# Patient Record
Sex: Female | Born: 1960 | Smoking: Current every day smoker
Health system: Southern US, Community
[De-identification: ages and names within clinical notes are randomized; demographics above are authoritative.]

## PROBLEM LIST (undated history)

## (undated) DIAGNOSIS — N200 Calculus of kidney: Secondary | ICD-10-CM

## (undated) DIAGNOSIS — I10 Essential (primary) hypertension: Secondary | ICD-10-CM

## (undated) DIAGNOSIS — I219 Acute myocardial infarction, unspecified: Secondary | ICD-10-CM

## (undated) DIAGNOSIS — E785 Hyperlipidemia, unspecified: Secondary | ICD-10-CM

## (undated) DIAGNOSIS — N189 Chronic kidney disease, unspecified: Secondary | ICD-10-CM

## (undated) DIAGNOSIS — I809 Phlebitis and thrombophlebitis of unspecified site: Secondary | ICD-10-CM

## (undated) DIAGNOSIS — D689 Coagulation defect, unspecified: Secondary | ICD-10-CM

## (undated) DIAGNOSIS — J189 Pneumonia, unspecified organism: Secondary | ICD-10-CM

## (undated) DIAGNOSIS — N186 End stage renal disease: Secondary | ICD-10-CM

## (undated) DIAGNOSIS — D649 Anemia, unspecified: Secondary | ICD-10-CM

## (undated) HISTORY — DX: Anemia, unspecified: D64.9

## (undated) HISTORY — PX: CYSTOSCOPY: SUR368

## (undated) HISTORY — DX: Hyperlipidemia, unspecified: E78.5

## (undated) HISTORY — DX: Essential (primary) hypertension: I10

## (undated) HISTORY — DX: Chronic kidney disease, unspecified: N18.9

## (undated) HISTORY — DX: Coagulation defect, unspecified: D68.9

## (undated) HISTORY — PX: INSERTION OF DIALYSIS CATHETER: SHX1324

---

## 2014-08-03 ENCOUNTER — Other Ambulatory Visit: Payer: Self-pay | Admitting: *Deleted

## 2014-08-03 ENCOUNTER — Encounter: Payer: Self-pay | Admitting: Vascular Surgery

## 2014-08-03 DIAGNOSIS — Z01810 Encounter for preprocedural cardiovascular examination: Secondary | ICD-10-CM

## 2014-08-03 DIAGNOSIS — N186 End stage renal disease: Secondary | ICD-10-CM

## 2014-08-18 ENCOUNTER — Encounter: Payer: Self-pay | Admitting: Vascular Surgery

## 2014-08-23 ENCOUNTER — Ambulatory Visit (INDEPENDENT_AMBULATORY_CARE_PROVIDER_SITE_OTHER): Payer: Medicaid Other | Admitting: Vascular Surgery

## 2014-08-23 ENCOUNTER — Encounter: Payer: Self-pay | Admitting: Vascular Surgery

## 2014-08-23 ENCOUNTER — Ambulatory Visit (HOSPITAL_COMMUNITY)
Admission: RE | Admit: 2014-08-23 | Discharge: 2014-08-23 | Disposition: A | Discharge status: Home / Self Care | Payer: Medicaid Other | Source: Ambulatory Visit | Attending: Vascular Surgery | Admitting: Vascular Surgery

## 2014-08-23 ENCOUNTER — Ambulatory Visit (INDEPENDENT_AMBULATORY_CARE_PROVIDER_SITE_OTHER)
Admission: RE | Admit: 2014-08-23 | Discharge: 2014-08-23 | Disposition: A | Discharge status: Home / Self Care | Payer: Medicaid Other | Source: Ambulatory Visit | Attending: Vascular Surgery | Admitting: Vascular Surgery

## 2014-08-23 VITALS — BP 130/76 | HR 86 | Ht 66.0 in | Wt 168.7 lb

## 2014-08-23 DIAGNOSIS — Z01810 Encounter for preprocedural cardiovascular examination: Secondary | ICD-10-CM

## 2014-08-23 DIAGNOSIS — N179 Acute kidney failure, unspecified: Secondary | ICD-10-CM

## 2014-08-23 DIAGNOSIS — N186 End stage renal disease: Secondary | ICD-10-CM

## 2014-08-23 NOTE — Progress Notes (Signed)
  HISTORY AND PHYSICAL   History of Present Illness:  Patient is a 53 y.o. year old female who presents for placement of a permanent hemodialysis access. The patient is right handed .  The patient is currently on hemodialysis.  The cause of renal failure is thought to be secondary to NSAIDS.  Other chronic medical problems include .  Past Medical History  Diagnosis Date  . Hyperlipidemia   . Anemia   . Hypertension   . Chronic kidney disease   . Coagulation defect     History reviewed. No pertinent past surgical history.   Social History History  Substance Use Topics  . Smoking status: Current Every Day Smoker -- 0.50 packs/day    Types: Cigarettes  . Smokeless tobacco: Not on file  . Alcohol Use: No    Family History Family History  Problem Relation Age of Onset  . Cancer Mother     Allergies  Allergies  Allergen Reactions  . Ampicillin Other (See Comments)    Patient states per office notes from [facility] that it makes her gut bleed and swell  . Penicillins Hives     Current Outpatient Prescriptions  Medication Sig Dispense Refill  . atorvastatin (LIPITOR) 80 MG tablet Take 80 mg by mouth daily.    . furosemide (LASIX) 80 MG tablet Take 80 mg by mouth. Take on non-dialysis days    . magnesium oxide (MAG-OX) 400 MG tablet Take 400 mg by mouth 2 (two) times daily.    . metoprolol tartrate (LOPRESSOR) 25 MG tablet Take 25 mg by mouth at bedtime.    . pantoprazole (PROTONIX) 40 MG tablet Take 40 mg by mouth daily.    . ACETAMINOPHEN PO Take 650 mg by mouth 4 (four) times daily as needed.     No current facility-administered medications for this visit.    ROS:   General:  No weight loss, Fever, chills  HEENT: No recent headaches, no nasal bleeding, no visual changes, no sore throat  Neurologic: No dizziness, blackouts, seizures. No recent symptoms of stroke or mini- stroke. No recent episodes of slurred speech, or temporary  blindness.  Cardiac: No recent episodes of chest pain/pressure, no shortness of breath at rest.  No shortness of breath with exertion.  Denies history of atrial fibrillation or irregular heartbeat  Vascular: No history of rest pain in feet.  No history of claudication.  No history of non-healing ulcer, No history of DVT   Pulmonary: No home oxygen, no productive cough, no hemoptysis,  No asthma or wheezing  Musculoskeletal:  [x ] Arthritis, [ ] Low back pain,  [x ] Joint pain  Hematologic:No history of hypercoagulable state.  No history of easy bleeding.  No history of anemia  Gastrointestinal: No hematochezia or melena,  No gastroesophageal reflux, no trouble swallowing  Urinary: [x ] chronic Kidney disease, [x ] on HD - [ ] MWF or [x ] TTHS, [ ] Burning with urination, [ ] Frequent urination, [ ] Difficulty urinating;   Skin: No rashes  Psychological: No history of anxiety,  No history of depression   Physical Examination  Filed Vitals:   08/23/14 1519  BP: 130/76  Pulse: 86  Height: 5' 6" (1.676 m)  Weight: 168 lb 11.2 oz (76.522 kg)  SpO2: 95%    Body mass index is 27.24 kg/(m^2).  General:  Alert and oriented, no acute distress HEENT: Normal Neck: No bruit or JVD Pulmonary: Clear to auscultation bilaterally Cardiac: Regular Rate   and Rhythm without murmur Gastrointestinal: Soft, non-tender, non-distended, no mass, no scars Skin: No rash Extremity Pulses:  2+ radial, brachial pulses bilaterally, right IJ in place Musculoskeletal: No deformity or edema  Neurologic: Upper and lower extremity motor 5/5 and symmetric  DATA:  Vein mapping Acceptable cephalic veins bilaterally > 0.3 Arteries are triphasic flow bilateral  ASSESSMENT:  Acute kidney failure   PLAN: Left AV fistula creation.  Dr. Dickson discussed the risks and benefits of surgery.  She is not on any anticoagulation therapy currently.    Christie Montgomery PA-C [facility]  [place]  The patient was seen in the office today in conjunction with Dr. Dickson 

## 2014-08-24 ENCOUNTER — Other Ambulatory Visit: Payer: Self-pay

## 2014-09-07 ENCOUNTER — Encounter (HOSPITAL_COMMUNITY): Payer: Self-pay | Admitting: *Deleted

## 2014-09-07 MED ORDER — VANCOMYCIN HCL IN DEXTROSE 1-5 GM/200ML-% IV SOLN
1000.0000 mg | INTRAVENOUS | Status: AC
  Administered 2014-09-08: 1000 mg via INTRAVENOUS
  Filled 2014-09-07: qty 200

## 2014-09-07 MED ORDER — CHLORHEXIDINE GLUCONATE CLOTH 2 % EX PADS: 6.0000 | MEDICATED_PAD | CUTANEOUS | Status: DC

## 2014-09-07 MED ORDER — SODIUM CHLORIDE 0.9 % IV SOLN: INTRAVENOUS | Status: DC

## 2014-09-07 NOTE — Progress Notes (Signed)
Pt denies SOB, chest pain, and being under the care of a cardiologist. Pt denies having a stress test, echo and cardiac cath. Pt stated that a chest x ray and EKG was done at [facility]; records requested. Pt made aware to stop taking otc vitamins, NSAID's and herbal medications. Pt verbalized understanding of all pre-op instructions.

## 2014-09-08 ENCOUNTER — Ambulatory Visit (HOSPITAL_COMMUNITY): Payer: Medicaid Other | Admitting: Anesthesiology

## 2014-09-08 ENCOUNTER — Encounter (HOSPITAL_COMMUNITY)
Admission: RE | Disposition: A | Discharge status: Home / Self Care | Payer: Self-pay | Source: Ambulatory Visit | Attending: Vascular Surgery

## 2014-09-08 ENCOUNTER — Ambulatory Visit (HOSPITAL_COMMUNITY)
Admission: RE | Admit: 2014-09-08 | Discharge: 2014-09-08 | Disposition: A | Discharge status: Home / Self Care | Payer: Medicaid Other | Source: Ambulatory Visit | Attending: Vascular Surgery | Admitting: Vascular Surgery

## 2014-09-08 ENCOUNTER — Encounter (HOSPITAL_COMMUNITY): Payer: Self-pay

## 2014-09-08 ENCOUNTER — Other Ambulatory Visit: Payer: Medicaid Other

## 2014-09-08 DIAGNOSIS — E785 Hyperlipidemia, unspecified: Secondary | ICD-10-CM | POA: Diagnosis not present

## 2014-09-08 DIAGNOSIS — N179 Acute kidney failure, unspecified: Secondary | ICD-10-CM | POA: Insufficient documentation

## 2014-09-08 DIAGNOSIS — Z992 Dependence on renal dialysis: Secondary | ICD-10-CM | POA: Insufficient documentation

## 2014-09-08 DIAGNOSIS — N186 End stage renal disease: Secondary | ICD-10-CM | POA: Diagnosis not present

## 2014-09-08 DIAGNOSIS — F17210 Nicotine dependence, cigarettes, uncomplicated: Secondary | ICD-10-CM | POA: Diagnosis not present

## 2014-09-08 DIAGNOSIS — I120 Hypertensive chronic kidney disease with stage 5 chronic kidney disease or end stage renal disease: Secondary | ICD-10-CM | POA: Diagnosis present

## 2014-09-08 DIAGNOSIS — N185 Chronic kidney disease, stage 5: Secondary | ICD-10-CM | POA: Diagnosis not present

## 2014-09-08 DIAGNOSIS — Z791 Long term (current) use of non-steroidal anti-inflammatories (NSAID): Secondary | ICD-10-CM | POA: Insufficient documentation

## 2014-09-08 DIAGNOSIS — Z48812 Encounter for surgical aftercare following surgery on the circulatory system: Secondary | ICD-10-CM

## 2014-09-08 DIAGNOSIS — Z880 Allergy status to penicillin: Secondary | ICD-10-CM | POA: Diagnosis not present

## 2014-09-08 HISTORY — DX: Calculus of kidney: N20.0

## 2014-09-08 HISTORY — DX: Pneumonia, unspecified organism: J18.9

## 2014-09-08 HISTORY — PX: AV FISTULA PLACEMENT: SHX1204

## 2014-09-08 LAB — POCT I-STAT 4, (NA,K, GLUC, HGB,HCT)
GLUCOSE: 83 mg/dL (ref 65–99)
HEMATOCRIT: 34 % — AB (ref 36.0–46.0)
HEMOGLOBIN: 11.6 g/dL — AB (ref 12.0–15.0)
POTASSIUM: 3.5 mmol/L (ref 3.5–5.1)
SODIUM: 132 mmol/L — AB (ref 135–145)

## 2014-09-08 SURGERY — ARTERIOVENOUS (AV) FISTULA CREATION
Anesthesia: Monitor Anesthesia Care | Site: Arm Upper | Laterality: Left

## 2014-09-08 MED ORDER — ONDANSETRON HCL 4 MG/2ML IJ SOLN
INTRAMUSCULAR | Status: DC | PRN
  Administered 2014-09-08: 4 mg via INTRAVENOUS

## 2014-09-08 MED ORDER — PROPOFOL INFUSION 10 MG/ML OPTIME
INTRAVENOUS | Status: DC | PRN
  Administered 2014-09-08: 100 ug/kg/min via INTRAVENOUS

## 2014-09-08 MED ORDER — PROTAMINE SULFATE 10 MG/ML IV SOLN
INTRAVENOUS | Status: DC | PRN
  Administered 2014-09-08: 30 mg via INTRAVENOUS

## 2014-09-08 MED ORDER — LIDOCAINE HCL (PF) 1 % IJ SOLN
INTRAMUSCULAR | Status: DC | PRN
  Administered 2014-09-08: 30 mL

## 2014-09-08 MED ORDER — OXYCODONE HCL 5 MG PO TABS: 5.0000 mg | ORAL_TABLET | Freq: Four times a day (QID) | ORAL | Status: DC | PRN

## 2014-09-08 MED ORDER — HYDROMORPHONE HCL 1 MG/ML IJ SOLN: 0.2500 mg | INTRAMUSCULAR | Status: DC | PRN

## 2014-09-08 MED ORDER — LIDOCAINE HCL (PF) 1 % IJ SOLN
INTRAMUSCULAR | Status: AC
  Filled 2014-09-08: qty 30

## 2014-09-08 MED ORDER — SODIUM CHLORIDE 0.9 % IR SOLN
Status: DC | PRN
  Administered 2014-09-08: 07:00:00

## 2014-09-08 MED ORDER — FENTANYL CITRATE (PF) 100 MCG/2ML IJ SOLN
INTRAMUSCULAR | Status: DC | PRN
  Administered 2014-09-08 (×3): 25 ug via INTRAVENOUS
  Administered 2014-09-08 (×2): 50 ug via INTRAVENOUS
  Administered 2014-09-08: 25 ug via INTRAVENOUS

## 2014-09-08 MED ORDER — LACTATED RINGERS IV SOLN: INTRAVENOUS | Status: DC

## 2014-09-08 MED ORDER — PROMETHAZINE HCL 25 MG/ML IJ SOLN: 6.2500 mg | INTRAMUSCULAR | Status: DC | PRN

## 2014-09-08 MED ORDER — FENTANYL CITRATE (PF) 250 MCG/5ML IJ SOLN
INTRAMUSCULAR | Status: AC
  Filled 2014-09-08: qty 5

## 2014-09-08 MED ORDER — LIDOCAINE-EPINEPHRINE (PF) 1 %-1:200000 IJ SOLN
INTRAMUSCULAR | Status: AC
  Filled 2014-09-08: qty 30

## 2014-09-08 MED ORDER — MIDAZOLAM HCL 5 MG/5ML IJ SOLN
INTRAMUSCULAR | Status: DC | PRN
  Administered 2014-09-08: 2 mg via INTRAVENOUS

## 2014-09-08 MED ORDER — HEPARIN SODIUM (PORCINE) 1000 UNIT/ML IJ SOLN
INTRAMUSCULAR | Status: DC | PRN
  Administered 2014-09-08: 6000 [IU] via INTRAVENOUS

## 2014-09-08 MED ORDER — PROPOFOL 10 MG/ML IV BOLUS
INTRAVENOUS | Status: AC
  Filled 2014-09-08: qty 20

## 2014-09-08 MED ORDER — 0.9 % SODIUM CHLORIDE (POUR BTL) OPTIME
TOPICAL | Status: DC | PRN
  Administered 2014-09-08: 1000 mL

## 2014-09-08 MED ORDER — MEPERIDINE HCL 25 MG/ML IJ SOLN: 6.2500 mg | INTRAMUSCULAR | Status: DC | PRN

## 2014-09-08 MED ORDER — SODIUM CHLORIDE 0.9 % IV SOLN
INTRAVENOUS | Status: DC | PRN
  Administered 2014-09-08: 07:00:00 via INTRAVENOUS

## 2014-09-08 MED ORDER — MIDAZOLAM HCL 2 MG/2ML IJ SOLN
INTRAMUSCULAR | Status: AC
  Filled 2014-09-08: qty 4

## 2014-09-08 SURGICAL SUPPLY — 32 items
ARMBAND PINK RESTRICT EXTREMIT (MISCELLANEOUS) ×3 IMPLANT
CANISTER SUCTION 2500CC (MISCELLANEOUS) ×3 IMPLANT
CANNULA VESSEL 3MM 2 BLNT TIP (CANNULA) ×3 IMPLANT
CLIP TI MEDIUM 6 (CLIP) ×3 IMPLANT
CLIP TI WIDE RED SMALL 6 (CLIP) ×3 IMPLANT
COVER PROBE W GEL 5X96 (DRAPES) ×3 IMPLANT
DECANTER SPIKE VIAL GLASS SM (MISCELLANEOUS) ×3 IMPLANT
ELECT REM PT RETURN 9FT ADLT (ELECTROSURGICAL) ×3
ELECTRODE REM PT RTRN 9FT ADLT (ELECTROSURGICAL) ×1 IMPLANT
GLOVE BIO SURGEON STRL SZ 6.5 (GLOVE) ×4 IMPLANT
GLOVE BIO SURGEON STRL SZ7.5 (GLOVE) ×3 IMPLANT
GLOVE BIO SURGEONS STRL SZ 6.5 (GLOVE) ×2
GLOVE BIOGEL PI IND STRL 7.0 (GLOVE) ×1 IMPLANT
GLOVE BIOGEL PI IND STRL 8 (GLOVE) ×2 IMPLANT
GLOVE BIOGEL PI INDICATOR 7.0 (GLOVE) ×2
GLOVE BIOGEL PI INDICATOR 8 (GLOVE) ×4
GLOVE ECLIPSE 7.5 STRL STRAW (GLOVE) ×3 IMPLANT
GOWN STRL REUS W/ TWL LRG LVL3 (GOWN DISPOSABLE) ×3 IMPLANT
GOWN STRL REUS W/TWL LRG LVL3 (GOWN DISPOSABLE) ×6
KIT BASIN OR (CUSTOM PROCEDURE TRAY) ×3 IMPLANT
KIT ROOM TURNOVER OR (KITS) ×3 IMPLANT
LIQUID BAND (GAUZE/BANDAGES/DRESSINGS) ×3 IMPLANT
NS IRRIG 1000ML POUR BTL (IV SOLUTION) ×3 IMPLANT
PACK CV ACCESS (CUSTOM PROCEDURE TRAY) ×3 IMPLANT
PAD ARMBOARD 7.5X6 YLW CONV (MISCELLANEOUS) ×6 IMPLANT
SPONGE SURGIFOAM ABS GEL 100 (HEMOSTASIS) IMPLANT
SUT PROLENE 6 0 BV (SUTURE) ×3 IMPLANT
SUT VIC AB 3-0 SH 27 (SUTURE) ×2
SUT VIC AB 3-0 SH 27X BRD (SUTURE) ×1 IMPLANT
SUT VICRYL 4-0 PS2 18IN ABS (SUTURE) ×3 IMPLANT
UNDERPAD 30X30 INCONTINENT (UNDERPADS AND DIAPERS) ×3 IMPLANT
WATER STERILE IRR 1000ML POUR (IV SOLUTION) ×3 IMPLANT

## 2014-09-08 NOTE — Op Note (Signed)
    NAME: Christie Montgomery   MRN: [id number] DOB: 1960/12/31    DATE OF OPERATION: 09/08/2014  PREOP DIAGNOSIS: Stage V chronic kidney disease  POSTOP DIAGNOSIS: Same  PROCEDURE: Left brachial cephalic AV fistula  SURGEON: Di Kindle. Edilia Bo, MD, FACS  ASSIST: Doreatha Massed, PA  ANESTHESIA: local with sedation   EBL: minimal  INDICATIONS: Christie Montgomery is a 54 y.o. female who currently is on dialysis and has a functioning catheter.  She presents for new access.  FINDINGS: 4 mm upper arm cephalic vein.  TECHNIQUE: The patient was taken to the operating room and sedated by anesthesia. The left upper extremity was prepped and draped in the usual sterile fashion. Under ultrasound guidance, after the skin was anesthetized, a transverse incision was made at the antecubital level. Here the cephalic vein was dissected free and ligated distally. It irrigated up nicely with heparinized saline. The brachial artery was dissected free beneath the fascia. The patient was heparinized. The brachial artery was clamped proximally and distally and a longitudinal arteriotomy was made. The vein was sewn into side to the artery using continuous 60 proline suture. At the completion was a good thrill in the fistula. There was a good radial and ulnar signal with Doppler. Hemostasis was obtained in the wounds. The wound was closed with a deep layer of 3-0 Vicryl and the skin closed with 4-0 Vicryl. Liquid band was applied. The patient tolerated the procedure well and transferred to the recovery room in stable condition. All needle and sponge counts were correct.  Christie Ferrari, MD, FACS [facility]  DATE OF DICTATION:   09/08/2014

## 2014-09-08 NOTE — Anesthesia Preprocedure Evaluation (Addendum)
Anesthesia Evaluation  Patient identified by MRN, date of birth, ID band Patient awake    Reviewed: Allergy & Precautions, NPO status , Patient's Chart, lab work & pertinent test results, reviewed documented beta blocker date and time   Airway Mallampati: II       Dental  (+) Poor Dentition, Loose, Missing   Pulmonary Current Smoker,  breath sounds clear to auscultation  + wheezing      Cardiovascular hypertension, Pt. on medications Rhythm:Regular Rate:Normal     Neuro/Psych negative neurological ROS  negative psych ROS   GI/Hepatic negative GI ROS, Neg liver ROS, GERD-  Medicated,  Endo/Other  negative endocrine ROS  Renal/GU CRFRenal disease  negative genitourinary   Musculoskeletal negative musculoskeletal ROS (+)   Abdominal   Peds negative pediatric ROS (+)  Hematology negative hematology ROS (+)   Anesthesia Other Findings   Reproductive/Obstetrics negative OB ROS                           EKG: normal sinus rhythm.  Anesthesia Physical Anesthesia Plan  ASA: III  Anesthesia Plan: MAC   Post-op Pain Management:    Induction: Intravenous  Airway Management Planned: Natural Airway  Additional Equipment:   Intra-op Plan:   Post-operative Plan:   Informed Consent: I have reviewed the patients History and Physical, chart, labs and discussed the procedure including the risks, benefits and alternatives for the proposed anesthesia with the patient or authorized representative who has indicated his/her understanding and acceptance.   Dental advisory given  Plan Discussed with: CRNA  Anesthesia Plan Comments:         Anesthesia Quick Evaluation

## 2014-09-08 NOTE — Discharge Instructions (Signed)
° ° °  09/08/2014 Christie Montgomery [id number] 01-07-1961  Surgeon(s): Chuck Hint, MD  Procedure(s): LEFT BRACHIAL-CEPHALIC ARTERIOVENOUS (AV) FISTULA CREATION    Do not stick fistula for 12 weeks

## 2014-09-08 NOTE — Transfer of Care (Signed)
Immediate Anesthesia Transfer of Care Note  Patient: Christie Montgomery  Procedure(s) Performed: Procedure(s): LEFT BRACHIAL-CEPHALIC ARTERIOVENOUS (AV) FISTULA CREATION  (Left)  Patient Location: PACU  Anesthesia Type:MAC  Level of Consciousness: awake, alert  and oriented  Airway & Oxygen Therapy: Patient Spontanous Breathing and Patient connected to nasal cannula oxygen  Post-op Assessment: Report given to RN and Post -op Vital signs reviewed and stable  Post vital signs: Reviewed and stable  Last Vitals:  Filed Vitals:   09/08/14 0614  BP: 118/57  Pulse: 82  Temp: 36.1 C  Resp: 20    Complications: No apparent anesthesia complications

## 2014-09-08 NOTE — Anesthesia Postprocedure Evaluation (Signed)
  Anesthesia Post-op Note  Patient: Christie Montgomery  Procedure(s) Performed: Procedure(s): LEFT BRACHIAL-CEPHALIC ARTERIOVENOUS (AV) FISTULA CREATION  (Left)  Patient Location: PACU  Anesthesia Type:MAC  Level of Consciousness: awake, alert  and oriented  Airway and Oxygen Therapy: Patient Spontanous Breathing  Post-op Pain: mild  Post-op Assessment: Post-op Vital signs reviewed and Patient's Cardiovascular Status Stable              Post-op Vital Signs: Reviewed and stable  Last Vitals:  Filed Vitals:   09/08/14 0927  BP: 119/56  Pulse: 82  Temp:   Resp:     Complications: No apparent anesthesia complications

## 2014-09-08 NOTE — Interval H&P Note (Signed)
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 After consideration of risks, benefits and other options for treatment, the patient has consented to  Procedure(s): ARTERIOVENOUS (AV) FISTULA CREATION VERSUS GRAFT INSERTION (Left) as a surgical intervention .  The patient's history has been reviewed, patient examined, no change in status, stable for surgery.  I have reviewed the patient's chart and labs.  Questions were answered to the patient's satisfaction.     [name]

## 2014-09-08 NOTE — H&P (View-Only) (Signed)
HISTORY AND PHYSICAL   History of Present Illness:  Patient is a 54 y.o. year old female who presents for placement of a permanent hemodialysis access. The patient is right handed .  The patient is currently on hemodialysis.  The cause of renal failure is thought to be secondary to NSAIDS.  Other chronic medical problems include .  Past Medical History  Diagnosis Date  . Hyperlipidemia   . Anemia   . Hypertension   . Chronic kidney disease   . Coagulation defect     History reviewed. No pertinent past surgical history.   Social History History  Substance Use Topics  . Smoking status: Current Every Day Smoker -- 0.50 packs/day    Types: Cigarettes  . Smokeless tobacco: Not on file  . Alcohol Use: No    Family History Family History  Problem Relation Age of Onset  . Cancer Mother     Allergies  Allergies  Allergen Reactions  . Ampicillin Other (See Comments)    Patient states per office notes from [facility] that it makes her gut bleed and swell  . Penicillins Hives     Current Outpatient Prescriptions  Medication Sig Dispense Refill  . atorvastatin (LIPITOR) 80 MG tablet Take 80 mg by mouth daily.    . furosemide (LASIX) 80 MG tablet Take 80 mg by mouth. Take on non-dialysis days    . magnesium oxide (MAG-OX) 400 MG tablet Take 400 mg by mouth 2 (two) times daily.    . metoprolol tartrate (LOPRESSOR) 25 MG tablet Take 25 mg by mouth at bedtime.    . pantoprazole (PROTONIX) 40 MG tablet Take 40 mg by mouth daily.    . ACETAMINOPHEN PO Take 650 mg by mouth 4 (four) times daily as needed.     No current facility-administered medications for this visit.    ROS:   General:  No weight loss, Fever, chills  HEENT: No recent headaches, no nasal bleeding, no visual changes, no sore throat  Neurologic: No dizziness, blackouts, seizures. No recent symptoms of stroke or mini- stroke. No recent episodes of slurred speech, or temporary  blindness.  Cardiac: No recent episodes of chest pain/pressure, no shortness of breath at rest.  No shortness of breath with exertion.  Denies history of atrial fibrillation or irregular heartbeat  Vascular: No history of rest pain in feet.  No history of claudication.  No history of non-healing ulcer, No history of DVT   Pulmonary: No home oxygen, no productive cough, no hemoptysis,  No asthma or wheezing  Musculoskeletal:  [x ] Arthritis, [ ]  Low back pain,  [x ] Joint pain  Hematologic:No history of hypercoagulable state.  No history of easy bleeding.  No history of anemia  Gastrointestinal: No hematochezia or melena,  No gastroesophageal reflux, no trouble swallowing  Urinary: [x ] chronic Kidney disease, [x ] on HD - [ ]  MWF or [x ] TTHS, [ ]  Burning with urination, [ ]  Frequent urination, [ ]  Difficulty urinating;   Skin: No rashes  Psychological: No history of anxiety,  No history of depression   Physical Examination  Filed Vitals:   08/23/14 1519  BP: 130/76  Pulse: 86  Height: 5\' 6"  (1.676 m)  Weight: 168 lb 11.2 oz (76.522 kg)  SpO2: 95%    Body mass index is 27.24 kg/(m^2).  General:  Alert and oriented, no acute distress HEENT: Normal Neck: No bruit or JVD Pulmonary: Clear to auscultation bilaterally Cardiac: Regular Rate  and Rhythm without murmur Gastrointestinal: Soft, non-tender, non-distended, no mass, no scars Skin: No rash Extremity Pulses:  2+ radial, brachial pulses bilaterally, right IJ in place Musculoskeletal: No deformity or edema  Neurologic: Upper and lower extremity motor 5/5 and symmetric  DATA:  Vein mapping Acceptable cephalic veins bilaterally > 0.3 Arteries are triphasic flow bilateral  ASSESSMENT:  Acute kidney failure   PLAN: Left AV fistula creation.  Dr. Edilia Bo discussed the risks and benefits of surgery.  She is not on any anticoagulation therapy currently.    Thomasena Edis, EMMA MAUREEN PA-C [facility]  [place]  The patient was seen in the office today in conjunction with Dr. Edilia Bo

## 2014-09-08 NOTE — Addendum Note (Signed)
Addendum  created 09/08/14 1031 by Dairl Ponder, CRNA   Modules edited: Anesthesia Flowsheet

## 2014-09-11 ENCOUNTER — Encounter (HOSPITAL_COMMUNITY): Payer: Self-pay | Admitting: Vascular Surgery

## 2014-09-12 ENCOUNTER — Telehealth: Payer: Self-pay | Admitting: Vascular Surgery

## 2014-09-12 NOTE — Telephone Encounter (Signed)
Spoke with pts spouse- dpm

## 2014-09-12 NOTE — Telephone Encounter (Signed)
-----   Message from Phillips Odor, RN sent at 09/08/2014  9:05 AM EDT ----- Regarding: Joyce Gross log; also needs access duplex and f/u with [facility] in 6 wks.   ----- Message -----    From: Dara Lords, PA-C    Sent: 09/08/2014   8:43 AM      To: [facility] Charge Pool  S/p left BC AVF 09/08/14.  F/u with [facility] in 6 weeks with duplex.  Thanks, Lelon Mast

## 2014-09-20 ENCOUNTER — Inpatient Hospital Stay (HOSPITAL_COMMUNITY)
Admission: EM | Admit: 2014-09-20 | Discharge: 2014-09-23 | DRG: 189 | Disposition: A | Discharge status: Home / Self Care | Payer: Medicaid Other | Attending: Internal Medicine | Admitting: Internal Medicine

## 2014-09-20 ENCOUNTER — Emergency Department (HOSPITAL_COMMUNITY): Payer: Medicaid Other

## 2014-09-20 ENCOUNTER — Encounter (HOSPITAL_COMMUNITY): Payer: Self-pay

## 2014-09-20 DIAGNOSIS — R0602 Shortness of breath: Secondary | ICD-10-CM

## 2014-09-20 DIAGNOSIS — M898X9 Other specified disorders of bone, unspecified site: Secondary | ICD-10-CM | POA: Diagnosis present

## 2014-09-20 DIAGNOSIS — R0600 Dyspnea, unspecified: Secondary | ICD-10-CM

## 2014-09-20 DIAGNOSIS — J90 Pleural effusion, not elsewhere classified: Secondary | ICD-10-CM | POA: Diagnosis present

## 2014-09-20 DIAGNOSIS — Z9115 Patient's noncompliance with renal dialysis: Secondary | ICD-10-CM | POA: Diagnosis present

## 2014-09-20 DIAGNOSIS — J811 Chronic pulmonary edema: Principal | ICD-10-CM | POA: Diagnosis present

## 2014-09-20 DIAGNOSIS — I120 Hypertensive chronic kidney disease with stage 5 chronic kidney disease or end stage renal disease: Secondary | ICD-10-CM | POA: Diagnosis present

## 2014-09-20 DIAGNOSIS — D649 Anemia, unspecified: Secondary | ICD-10-CM | POA: Diagnosis present

## 2014-09-20 DIAGNOSIS — Z992 Dependence on renal dialysis: Secondary | ICD-10-CM

## 2014-09-20 DIAGNOSIS — F17210 Nicotine dependence, cigarettes, uncomplicated: Secondary | ICD-10-CM | POA: Diagnosis present

## 2014-09-20 DIAGNOSIS — E8770 Fluid overload, unspecified: Secondary | ICD-10-CM | POA: Diagnosis present

## 2014-09-20 DIAGNOSIS — E785 Hyperlipidemia, unspecified: Secondary | ICD-10-CM | POA: Diagnosis present

## 2014-09-20 DIAGNOSIS — N186 End stage renal disease: Secondary | ICD-10-CM | POA: Diagnosis present

## 2014-09-20 DIAGNOSIS — Z79899 Other long term (current) drug therapy: Secondary | ICD-10-CM

## 2014-09-20 DIAGNOSIS — R0789 Other chest pain: Secondary | ICD-10-CM

## 2014-09-20 DIAGNOSIS — I2699 Other pulmonary embolism without acute cor pulmonale: Secondary | ICD-10-CM

## 2014-09-20 HISTORY — DX: End stage renal disease: N18.6

## 2014-09-20 LAB — BASIC METABOLIC PANEL
Anion gap: 14 (ref 5–15)
BUN: 21 mg/dL — ABNORMAL HIGH (ref 6–20)
CO2: 22 mmol/L (ref 22–32)
CREATININE: 10.55 mg/dL — AB (ref 0.44–1.00)
Calcium: 10.9 mg/dL — ABNORMAL HIGH (ref 8.9–10.3)
Chloride: 94 mmol/L — ABNORMAL LOW (ref 101–111)
GFR calc Af Amer: 4 mL/min — ABNORMAL LOW (ref >60)
GFR, EST NON AFRICAN AMERICAN: 4 mL/min — AB (ref >60)
GLUCOSE: 95 mg/dL (ref 65–99)
POTASSIUM: 4.9 mmol/L (ref 3.5–5.1)
SODIUM: 130 mmol/L — AB (ref 135–145)

## 2014-09-20 LAB — CBC
HCT: 32.3 % — ABNORMAL LOW (ref 36.0–46.0)
Hemoglobin: 11.2 g/dL — ABNORMAL LOW (ref 12.0–15.0)
MCH: 28.4 pg (ref 26.0–34.0)
MCHC: 34.7 g/dL (ref 30.0–36.0)
MCV: 81.8 fL (ref 78.0–100.0)
Platelets: 237 10*3/uL (ref 150–400)
RBC: 3.95 MIL/uL (ref 3.87–5.11)
RDW: 18.9 % — AB (ref 11.5–15.5)
WBC: 10.7 10*3/uL — ABNORMAL HIGH (ref 4.0–10.5)

## 2014-09-20 LAB — I-STAT TROPONIN, ED: TROPONIN I, POC: 0.05 ng/mL (ref 0.00–0.08)

## 2014-09-20 NOTE — ED Provider Notes (Signed)
CSN: [id number]     Arrival date & time 09/20/14  2242 History  This chart was scribed for Loren Racer, MD by Lyndel Safe, ED Scribe. This patient was seen in room A04C/A04C and the patient's care was started 12:16 AM.  Chief Complaint  Patient presents with  . Chest Pain   The history is provided by the patient. No language interpreter was used.   HPI Comments: Christie Montgomery is a 54 y.o. female, who is on T,Th, SAT dialysis, presents to the Emergency Department complaining of gradual onset, gradually worsening, constant left-sided chest pain beneath her left breast and left rib cage pain onset 4 days ago. She states her left-sided rib cage pain is exacerbated with movement and coughing. No radiation. Patient has had a productive cough of clear sputum for greater than a week. She denies any fever or chills. She's had persistent shortness of breath worse with exertion. The pt notes she missed her dialysis appointment on Tuesday, 1 day ago, due to her CP. Pt took an oxycodone 4 hours ago with mild relief. Denies back pain, fevers, chills, or abdominal pain. No lower extremity swelling or pain.  Past Medical History  Diagnosis Date  . Hyperlipidemia   . Anemia   . Hypertension   . Chronic kidney disease   . Coagulation defect   . Kidney stones   . Pneumonia    Past Surgical History  Procedure Laterality Date  . Insertion of dialysis catheter    . Cystoscopy    . Av fistula placement Left 09/08/2014    Procedure: LEFT BRACHIAL-CEPHALIC ARTERIOVENOUS (AV) FISTULA CREATION ;  Surgeon: Chuck Hint, MD;  Location: [facility] OR;  Service: Vascular;  Laterality: Left;   Family History  Problem Relation Age of Onset  . Cancer Mother    Social History  Substance Use Topics  . Smoking status: Current Every Day Smoker -- 0.25 packs/day    Types: Cigarettes  . Smokeless tobacco: Never Used  . Alcohol Use: No   OB History    No data available     Review of Systems   Constitutional: Negative for fever and chills.  Respiratory: Positive for cough and shortness of breath.   Cardiovascular: Positive for chest pain. Negative for palpitations and leg swelling.  Gastrointestinal: Negative for nausea, vomiting, abdominal pain, diarrhea and constipation.  Musculoskeletal: Negative for myalgias, back pain, neck pain and neck stiffness.  Skin: Negative for rash and wound.  Neurological: Negative for dizziness, weakness, light-headedness, numbness and headaches.  All other systems reviewed and are negative.  Allergies  Ampicillin; Hepatitis b virus vaccine; and Penicillins  Home Medications   Prior to Admission medications   Medication Sig Start Date End Date Taking? Authorizing Provider  ACETAMINOPHEN PO Take 650 mg by mouth 4 (four) times daily as needed (pain).     Historical Provider, MD  furosemide (LASIX) 80 MG tablet Take 80 mg by mouth. Take on non-dialysis days    Historical Provider, MD  magnesium oxide (MAG-OX) 400 MG tablet Take 400 mg by mouth 2 (two) times daily.    Historical Provider, MD  metoprolol succinate (TOPROL-XL) 25 MG 24 hr tablet Take 25 mg by mouth at bedtime.    Historical Provider, MD  oxyCODONE (ROXICODONE) 5 MG immediate release tablet Take 1 tablet (5 mg total) by mouth every 6 (six) hours as needed. 09/08/14   Samantha J Rhyne, PA-C  pantoprazole (PROTONIX) 40 MG tablet Take 40 mg by mouth daily.    Historical  Provider, MD   BP 131/65 mmHg  Pulse 84  Temp(Src) 97.6 F (36.4 C) (Oral)  Resp 17  SpO2 91% Physical Exam  Constitutional: She is oriented to person, place, and time. She appears well-developed and well-nourished. No distress.  HENT:  Head: Normocephalic and atraumatic.  Mouth/Throat: Oropharynx is clear and moist.  Eyes: EOM are normal. Pupils are equal, round, and reactive to light.  Neck: Normal range of motion. Neck supple.  Cardiovascular: Normal rate and regular rhythm.   Pulmonary/Chest: Effort normal. No  respiratory distress. She has no wheezes. She has rales. She exhibits tenderness (patient's chest pain is completely reproduced with palpation over her anterior left inferior chest. There is no crepitance or deformity.).  Few crackles bilateral bases. Dialysis cath in the right chest  Abdominal: Soft. Bowel sounds are normal. She exhibits no distension and no mass. There is no tenderness. There is no rebound and no guarding.  Musculoskeletal: Normal range of motion. She exhibits no edema or tenderness.  No lower extremity swelling or pain.  Neurological: She is alert and oriented to person, place, and time.  Moves all extremities without deficit. Sensation grossly intact.  Skin: Skin is warm and dry. No rash noted. No erythema.  Psychiatric: She has a normal mood and affect. Her behavior is normal.  Nursing note and vitals reviewed.   ED Course  Procedures  DIAGNOSTIC STUDIES: Oxygen Saturation is 91% on RA, low by my interpretation.    COORDINATION OF CARE: 12:19 AM Discussed treatment plan with pt. Pt acknowledges and agrees to plan.   Labs Review Labs Reviewed  BASIC METABOLIC PANEL - Abnormal; Notable for the following:    Sodium 130 (*)    Chloride 94 (*)    BUN 21 (*)    Creatinine, Ser 10.55 (*)    Calcium 10.9 (*)    GFR calc non Af Amer 4 (*)    GFR calc Af Amer 4 (*)    All other components within normal limits  CBC - Abnormal; Notable for the following:    WBC 10.7 (*)    Hemoglobin 11.2 (*)    HCT 32.3 (*)    RDW 18.9 (*)    All other components within normal limits  BRAIN NATRIURETIC PEPTIDE - Abnormal; Notable for the following:    B Natriuretic Peptide 2200.5 (*)    All other components within normal limits  CBC  BASIC METABOLIC PANEL  TROPONIN I  TROPONIN I  TROPONIN I  I-STAT TROPOININ, ED    Imaging Review Dg Chest 2 View  09/20/2014   CLINICAL DATA:  Chest pain, shortness of breath  EXAM: CHEST  2 VIEW  COMPARISON:  None.  FINDINGS: There is a  dual-lumen right-sided central venous catheter.  There is a small partially loculated right pleural effusion. There is right basilar airspace disease. There is bilateral interstitial thickening. There is no pneumothorax. The heart and mediastinal contours are unremarkable.  The osseous structures are unremarkable.  IMPRESSION: 1. Small partial loculated right pleural effusion with right basilar airspace disease. This may reflect atelectasis versus pneumonia. 2. Mild bilateral interstitial thickening likely reflecting mild interstitial edema.   Electronically Signed   By: Elige Ko   On: 09/20/2014 23:28   I have personally reviewed and evaluated these images and lab results as part of my medical decision-making.   EKG Interpretation   Date/Time:  Wednesday September 20 2014 22:50:29 EDT Ventricular Rate:  95 PR Interval:  176 QRS Duration: 86 QT  Interval:  328 QTC Calculation: 412 R Axis:   85 Text Interpretation:  Normal sinus rhythm Septal infarct , age  undetermined Abnormal ECG Confirmed by Ranae Palms  MD, Jalexis Breed ([id number]) on  09/20/2014 11:05:14 PM      MDM   Final diagnoses:  Admission for dialysis  Chest wall pain    I personally performed the services described in this documentation, which was scribed in my presence. The recorded information has been reviewed and is accurate.  Patient's chest pain is completely reproduced with palpation. Believe likely muscle skeletal. Initial troponin and EKG without any concerning findings. Patient does have evidence of pulmonary edema with desaturations into the 80s while ambulating. Discussed with nephrology and will dialyze in the morning. [facility] will admit.  Loren Racer, MD 09/21/14 [phone]

## 2014-09-20 NOTE — ED Notes (Signed)
Pt here for chest pain under her left breast since yesterday and was suppose to go to dialysis yesterday but didn't because of the cp. Took an oxycodone tonight at 2000 but it isn't helping. It may have dulled the pain some. Pt is a new dialysis pt since May 30. Has a hemodialysis cath in her right chest and fistula in the left forearm.

## 2014-09-20 NOTE — ED Notes (Signed)
Pt to xray

## 2014-09-21 ENCOUNTER — Encounter (HOSPITAL_COMMUNITY): Payer: Self-pay | Admitting: Nephrology

## 2014-09-21 DIAGNOSIS — N186 End stage renal disease: Secondary | ICD-10-CM | POA: Diagnosis present

## 2014-09-21 DIAGNOSIS — J90 Pleural effusion, not elsewhere classified: Secondary | ICD-10-CM | POA: Diagnosis present

## 2014-09-21 DIAGNOSIS — Z79899 Other long term (current) drug therapy: Secondary | ICD-10-CM | POA: Diagnosis not present

## 2014-09-21 DIAGNOSIS — E785 Hyperlipidemia, unspecified: Secondary | ICD-10-CM | POA: Diagnosis present

## 2014-09-21 DIAGNOSIS — Z992 Dependence on renal dialysis: Secondary | ICD-10-CM | POA: Diagnosis not present

## 2014-09-21 DIAGNOSIS — J810 Acute pulmonary edema: Secondary | ICD-10-CM | POA: Diagnosis not present

## 2014-09-21 DIAGNOSIS — J811 Chronic pulmonary edema: Secondary | ICD-10-CM | POA: Diagnosis present

## 2014-09-21 DIAGNOSIS — R079 Chest pain, unspecified: Secondary | ICD-10-CM | POA: Diagnosis not present

## 2014-09-21 DIAGNOSIS — Z9115 Patient's noncompliance with renal dialysis: Secondary | ICD-10-CM | POA: Diagnosis present

## 2014-09-21 DIAGNOSIS — E8770 Fluid overload, unspecified: Secondary | ICD-10-CM | POA: Diagnosis present

## 2014-09-21 DIAGNOSIS — M898X9 Other specified disorders of bone, unspecified site: Secondary | ICD-10-CM | POA: Diagnosis present

## 2014-09-21 DIAGNOSIS — F17210 Nicotine dependence, cigarettes, uncomplicated: Secondary | ICD-10-CM | POA: Diagnosis present

## 2014-09-21 DIAGNOSIS — I120 Hypertensive chronic kidney disease with stage 5 chronic kidney disease or end stage renal disease: Secondary | ICD-10-CM | POA: Diagnosis present

## 2014-09-21 DIAGNOSIS — D649 Anemia, unspecified: Secondary | ICD-10-CM | POA: Diagnosis present

## 2014-09-21 DIAGNOSIS — R0602 Shortness of breath: Secondary | ICD-10-CM | POA: Diagnosis present

## 2014-09-21 HISTORY — DX: End stage renal disease: N18.6

## 2014-09-21 LAB — TROPONIN I
TROPONIN I: 0.05 ng/mL — AB (ref <0.031)
TROPONIN I: 0.05 ng/mL — AB (ref <0.031)
Troponin I: 0.05 ng/mL — ABNORMAL HIGH (ref <0.031)

## 2014-09-21 LAB — BASIC METABOLIC PANEL
ANION GAP: 12 (ref 5–15)
BUN: 24 mg/dL — ABNORMAL HIGH (ref 6–20)
CHLORIDE: 95 mmol/L — AB (ref 101–111)
CO2: 24 mmol/L (ref 22–32)
Calcium: 10.5 mg/dL — ABNORMAL HIGH (ref 8.9–10.3)
Creatinine, Ser: 10.99 mg/dL — ABNORMAL HIGH (ref 0.44–1.00)
GFR calc non Af Amer: 3 mL/min — ABNORMAL LOW (ref >60)
GFR, EST AFRICAN AMERICAN: 4 mL/min — AB (ref >60)
Glucose, Bld: 76 mg/dL (ref 65–99)
Potassium: 4.9 mmol/L (ref 3.5–5.1)
Sodium: 131 mmol/L — ABNORMAL LOW (ref 135–145)

## 2014-09-21 LAB — RENAL FUNCTION PANEL
ANION GAP: 10 (ref 5–15)
Albumin: 2.7 g/dL — ABNORMAL LOW (ref 3.5–5.0)
BUN: 7 mg/dL (ref 6–20)
CHLORIDE: 100 mmol/L — AB (ref 101–111)
CO2: 28 mmol/L (ref 22–32)
Calcium: 9.2 mg/dL (ref 8.9–10.3)
Creatinine, Ser: 4.69 mg/dL — ABNORMAL HIGH (ref 0.44–1.00)
GFR calc Af Amer: 11 mL/min — ABNORMAL LOW (ref >60)
GFR, EST NON AFRICAN AMERICAN: 10 mL/min — AB (ref >60)
Glucose, Bld: 76 mg/dL (ref 65–99)
POTASSIUM: 3.7 mmol/L (ref 3.5–5.1)
Phosphorus: 3.2 mg/dL (ref 2.5–4.6)
Sodium: 138 mmol/L (ref 135–145)

## 2014-09-21 LAB — CBC
HEMATOCRIT: 28.5 % — AB (ref 36.0–46.0)
HEMOGLOBIN: 9.6 g/dL — AB (ref 12.0–15.0)
MCH: 27.7 pg (ref 26.0–34.0)
MCHC: 33.7 g/dL (ref 30.0–36.0)
MCV: 82.1 fL (ref 78.0–100.0)
Platelets: 218 10*3/uL (ref 150–400)
RBC: 3.47 MIL/uL — ABNORMAL LOW (ref 3.87–5.11)
RDW: 19.1 % — ABNORMAL HIGH (ref 11.5–15.5)
WBC: 10.1 10*3/uL (ref 4.0–10.5)

## 2014-09-21 LAB — BRAIN NATRIURETIC PEPTIDE: B Natriuretic Peptide: 2200.5 pg/mL — ABNORMAL HIGH (ref 0.0–100.0)

## 2014-09-21 LAB — MRSA PCR SCREENING: MRSA BY PCR: NEGATIVE

## 2014-09-21 MED ORDER — ALTEPLASE 2 MG IJ SOLR
2.0000 mg | Freq: Once | INTRAMUSCULAR | Status: DC | PRN
  Filled 2014-09-21: qty 2

## 2014-09-21 MED ORDER — HEPARIN SODIUM (PORCINE) 1000 UNIT/ML DIALYSIS: 1000.0000 [IU] | INTRAMUSCULAR | Status: DC | PRN

## 2014-09-21 MED ORDER — LIDOCAINE-PRILOCAINE 2.5-2.5 % EX CREA
1.0000 "application " | TOPICAL_CREAM | CUTANEOUS | Status: DC | PRN
  Filled 2014-09-21: qty 5

## 2014-09-21 MED ORDER — HEPARIN SODIUM (PORCINE) 5000 UNIT/ML IJ SOLN
5000.0000 [IU] | Freq: Three times a day (TID) | INTRAMUSCULAR | Status: DC
  Administered 2014-09-21 – 2014-09-23 (×5): 5000 [IU] via SUBCUTANEOUS
  Filled 2014-09-21 (×4): qty 1

## 2014-09-21 MED ORDER — LIDOCAINE HCL (PF) 1 % IJ SOLN: 5.0000 mL | INTRAMUSCULAR | Status: DC | PRN

## 2014-09-21 MED ORDER — SODIUM CHLORIDE 0.9 % IV SOLN: 100.0000 mL | INTRAVENOUS | Status: DC | PRN

## 2014-09-21 MED ORDER — ONDANSETRON HCL 4 MG/2ML IJ SOLN: 4.0000 mg | Freq: Four times a day (QID) | INTRAMUSCULAR | Status: DC | PRN

## 2014-09-21 MED ORDER — PENTAFLUOROPROP-TETRAFLUOROETH EX AERO: 1.0000 "application " | INHALATION_SPRAY | CUTANEOUS | Status: DC | PRN

## 2014-09-21 MED ORDER — NEPRO/CARBSTEADY PO LIQD: 237.0000 mL | ORAL | Status: DC | PRN

## 2014-09-21 MED ORDER — OXYCODONE HCL 5 MG PO TABS: 5.0000 mg | ORAL_TABLET | Freq: Four times a day (QID) | ORAL | Status: DC | PRN

## 2014-09-21 MED ORDER — MORPHINE SULFATE (PF) 2 MG/ML IV SOLN
1.0000 mg | INTRAVENOUS | Status: DC | PRN
  Administered 2014-09-21 – 2014-09-22 (×5): 1 mg via INTRAVENOUS
  Filled 2014-09-21 (×4): qty 1

## 2014-09-21 MED ORDER — HEPARIN SODIUM (PORCINE) 1000 UNIT/ML DIALYSIS: 2000.0000 [IU] | Freq: Once | INTRAMUSCULAR | Status: DC

## 2014-09-21 MED ORDER — METOPROLOL SUCCINATE ER 25 MG PO TB24: 25.0000 mg | ORAL_TABLET | Freq: Every day | ORAL | Status: DC

## 2014-09-21 MED ORDER — PANTOPRAZOLE SODIUM 40 MG PO TBEC
40.0000 mg | DELAYED_RELEASE_TABLET | Freq: Every day | ORAL | Status: DC
  Administered 2014-09-21 – 2014-09-23 (×3): 40 mg via ORAL
  Filled 2014-09-21 (×3): qty 1

## 2014-09-21 MED ORDER — ACETAMINOPHEN 325 MG PO TABS: 650.0000 mg | ORAL_TABLET | Freq: Four times a day (QID) | ORAL | Status: DC | PRN

## 2014-09-21 MED ORDER — MAGNESIUM OXIDE 400 (241.3 MG) MG PO TABS
400.0000 mg | ORAL_TABLET | Freq: Two times a day (BID) | ORAL | Status: DC
  Administered 2014-09-21 – 2014-09-22 (×4): 400 mg via ORAL
  Filled 2014-09-21 (×5): qty 1

## 2014-09-21 MED ORDER — MORPHINE SULFATE (PF) 2 MG/ML IV SOLN
INTRAVENOUS | Status: AC
  Filled 2014-09-21: qty 1

## 2014-09-21 NOTE — ED Notes (Signed)
Pt walked to bathroom, got winded, gave wheelchair. Placed pt on 3L O2 when she returned b/c pt SOB.

## 2014-09-21 NOTE — H&P (Signed)
[facility] History and Physical  Christie Montgomery NGE:[id number] DOB: 08/09/1960 DOA: 09/20/2014  Referring physician: EDP PCP: Christie Buddy, MD   Chief Complaint: SOB   HPI: Christie Montgomery is a 54 y.o. female with ESRD dialysis on TTS, missed dialysis on Sat and Tues this week.  Patient presents to ED with gradual onset, gradually worsening SOB and left sided CP.  Symptom of CP onset 4 days ago.  Her SOB onset on Sat and she did not go to dialysis "because I was SOB".  No fevers, chills, has had cough which makes her CP worse.  Cough productive of clear sputum.  Missed dialysis yesterday "due to chest pain".  Review of Systems: Systems reviewed.  As above, otherwise negative  Past Medical History  Diagnosis Date  . Hyperlipidemia   . Anemia   . Hypertension   . Chronic kidney disease   . Coagulation defect   . Kidney stones   . Pneumonia    Past Surgical History  Procedure Laterality Date  . Insertion of dialysis catheter    . Cystoscopy    . Av fistula placement Left 09/08/2014    Procedure: LEFT BRACHIAL-CEPHALIC ARTERIOVENOUS (AV) FISTULA CREATION ;  Surgeon: Christie Hint, MD;  Location: [facility] OR;  Service: Vascular;  Laterality: Left;   Social History:  reports that she has been smoking Cigarettes.  She has been smoking about 0.25 packs per day. She has never used smokeless tobacco. She reports that she does not drink alcohol or use illicit drugs.  Allergies  Allergen Reactions  . Ampicillin Other (See Comments)    Patient states per office notes from [facility] that it makes her gut bleed and swell  . Hepatitis B Virus Vaccine Hives  . Penicillins Hives    Family History  Problem Relation Age of Onset  . Cancer Mother      Prior to Admission medications   Medication Sig Start Date End Date Taking? Authorizing Provider  ACETAMINOPHEN PO Take 650 mg by mouth 4 (four) times daily as needed (pain).     Historical Provider, MD  furosemide  (LASIX) 80 MG tablet Take 80 mg by mouth. Take on non-dialysis days    Historical Provider, MD  magnesium oxide (MAG-OX) 400 MG tablet Take 400 mg by mouth 2 (two) times daily.    Historical Provider, MD  metoprolol succinate (TOPROL-XL) 25 MG 24 hr tablet Take 25 mg by mouth at bedtime.    Historical Provider, MD  oxyCODONE (ROXICODONE) 5 MG immediate release tablet Take 1 tablet (5 mg total) by mouth every 6 (six) hours as needed. 09/08/14   Christie J Rhyne, PA-C  pantoprazole (PROTONIX) 40 MG tablet Take 40 mg by mouth daily.    Historical Provider, MD   Physical Exam: Filed Vitals:   09/21/14 0300  BP: 126/69  Pulse: 91  Temp:   Resp: 20    BP 126/69 mmHg  Pulse 91  Temp(Src) 97.6 F (36.4 C) (Oral)  Resp 20  SpO2 90%  General Appearance:    Alert, oriented, no distress, appears stated age  Head:    Normocephalic, atraumatic  Eyes:    PERRL, EOMI, sclera non-icteric        Nose:   Nares without drainage or epistaxis. Mucosa, turbinates normal  Throat:   Moist mucous membranes. Oropharynx without erythema or exudate.  Neck:   Supple. No carotid bruits.  No thyromegaly.  No lymphadenopathy.   Back:     No CVA tenderness,  no spinal tenderness  Lungs:     Clear to auscultation bilaterally, without wheezes, rhonchi or rales  Chest wall:    No tenderness to palpitation  Heart:    Regular rate and rhythm without murmurs, gallops, rubs  Abdomen:     Soft, non-tender, nondistended, normal bowel sounds, no organomegaly  Genitalia:    deferred  Rectal:    deferred  Extremities:   No clubbing, cyanosis or edema.  Pulses:   2+ and symmetric all extremities  Skin:   Skin color, texture, turgor normal, no rashes or lesions  Lymph nodes:   Cervical, supraclavicular, and axillary nodes normal  Neurologic:   CNII-XII intact. Normal strength, sensation and reflexes      throughout    Labs on Admission:  Basic Metabolic Panel:  Recent Labs Lab 09/20/14 2303  NA 130*  K 4.9  CL 94*   CO2 22  GLUCOSE 95  BUN 21*  CREATININE 10.55*  CALCIUM 10.9*   Liver Function Tests: No results for input(s): AST, ALT, ALKPHOS, BILITOT, PROT, ALBUMIN in the last 168 hours. No results for input(s): LIPASE, AMYLASE in the last 168 hours. No results for input(s): AMMONIA in the last 168 hours. CBC:  Recent Labs Lab 09/20/14 2303  WBC 10.7*  HGB 11.2*  HCT 32.3*  MCV 81.8  PLT 237   Cardiac Enzymes: No results for input(s): CKTOTAL, CKMB, CKMBINDEX, TROPONINI in the last 168 hours.  BNP (last 3 results) No results for input(s): PROBNP in the last 8760 hours. CBG: No results for input(s): GLUCAP in the last 168 hours.  Radiological Exams on Admission: Dg Chest 2 View  09/20/2014   CLINICAL DATA:  Chest pain, shortness of breath  EXAM: CHEST  2 VIEW  COMPARISON:  None.  FINDINGS: There is a dual-lumen right-sided central venous catheter.  There is a small partially loculated right pleural effusion. There is right basilar airspace disease. There is bilateral interstitial thickening. There is no pneumothorax. The heart and mediastinal contours are unremarkable.  The osseous structures are unremarkable.  IMPRESSION: 1. Small partial loculated right pleural effusion with right basilar airspace disease. This may reflect atelectasis versus pneumonia. 2. Mild bilateral interstitial thickening likely reflecting mild interstitial edema.   Electronically Signed   By: Elige Ko   On: 09/20/2014 23:28    EKG: Independently reviewed.  Assessment/Plan Principal Problem:   Admission for dialysis Active Problems:   Pulmonary edema   1. ESRD - admit for dialysis, nephrology has already been consulted and will dialyze in AM 2. SOB - Most likely due to the pulmonary edema that is showing up on CXR, the pulmonary edema in turn is presumably from missing 2 dialysis sessions. 1. Less likely is a new PNA 2. Plan is to dialize patient, and see if her SOB resolves, if not then may need repeat  CXR vs CT chest to see if the patient does indeed have active PNA in the R base.  Seems less likely with 0/4 SIRS criteria, and another obvious and present reason for SOB (pulmonary edema which is seen on CXR).   Code Status: Full  Family Communication: Family at bedside Disposition Plan: Admit to inpatient   Time spent: 30 min  GARDNER, JARED M. [facility] Pager [phone]  If 7AM-7PM, please contact the day team taking care of the patient [email or web address] Password [facility] 09/21/2014, 3:17 AM

## 2014-09-21 NOTE — ED Notes (Signed)
EDP at bedside  

## 2014-09-21 NOTE — Progress Notes (Signed)
Patient admitted earlier this morning. H&P was reviewed. Patient seen and examined.  S; Patient complains of left-sided chest pain which has been ongoing for a week. Admits to shortness of breath. She missed 2 of her dialysis sessions.  O: Vital signs reviewed.  Lungs reveal crackles bilaterally. Chest wall is tender to palpation on the left, reproducing her pain S1, S2 is normal, regular. No S3, S4, no rubs, murmurs or bruits Abdomen is soft, nontender, nondistended. Bowel sounds present. No masses or organomegaly  EKG does not show any acute ischemic changes. Troponins were not significantly elevated.  Chest x-ray showed right-sided pleural effusion with pulmonary edema  A/P: Her symptoms are consistent with the pulmonary edema secondary to missed dialysis. Chest pain is probably due to fluid overload. There appears to be a musculoskeletal component. EKG does not show any acute ischemic changes. Troponins minimally elevated and likely due to her renal failure. We'll reassess pain after she has been dialyzed. Chest x-ray also showed right-sided pleural effusion. This will need to be followed up. At this time there is no suspicion of an infectious process. Discussed with nephrology. Plan is for dialysis today.  Stevenson Windmiller 1:33 PM

## 2014-09-21 NOTE — Assessment & Plan Note (Signed)
Patient describes becoming sick in May 2016 with PNA, was at [facility] then transferred to [facility].  Had septic shock and renal failure that didn't improve, was started on dialysis. Has been on HD since.  Denies hx of renal failure prior to that admission. Had LUA AVF done Aug 2016.  Currently gets HD in  TTS schedule.

## 2014-09-21 NOTE — Consult Note (Signed)
Renal Service Consult Note [facility]  Christie Montgomery 09/21/2014 Christie Montgomery Requesting Physician:  Dr Rito Ehrlich  Reason for Consult:  ESRD pt with SOB/ L chest pain HPI: The patient is a 54 y.o. year-old with hx of HTN, HL and ESRD started HD during hospital stay at [facility] in May-June this year.  Had acute renal problems per patient, was started on dialysis and has not recovered. Gets HD now in [place] on TTS schedule.  Presenting with SOB, orthopnea , some leg swelling. Coughing a lot the last 3-4 days, and now has pain in L lateral upper chest , worse w coughing. Denies recent f/c/s, abd pain , n/v/d.  Asked to see for dialysis.  CXR showing IS edema, R effusion.    ROS  no joint pain   no HA  no confusoin   no abd pain  no abd swelling  Past Medical History  Past Medical History  Diagnosis Date  . Hyperlipidemia   . Anemia   . Hypertension   . Chronic kidney disease   . Coagulation defect   . Kidney stones   . Pneumonia    Past Surgical History  Past Surgical History  Procedure Laterality Date  . Insertion of dialysis catheter    . Cystoscopy    . Av fistula placement Left 09/08/2014    Procedure: LEFT BRACHIAL-CEPHALIC ARTERIOVENOUS (AV) FISTULA CREATION ;  Surgeon: Chuck Hint, MD;  Location: [facility] OR;  Service: Vascular;  Laterality: Left;   Family History  Family History  Problem Relation Age of Onset  . Cancer Mother    Social History  reports that she has been smoking Cigarettes.  She has been smoking about 0.25 packs per day. She has never used smokeless tobacco. She reports that she does not drink alcohol or use illicit drugs. Allergies  Allergies  Allergen Reactions  . Ampicillin Other (See Comments)    Patient states per office notes from [facility] that it makes her gut bleed and swell  . Hepatitis B Virus Vaccine Hives  . Penicillins Hives  . Oxycodone Nausea And Vomiting  . Tramadol Nausea And Vomiting   Home  medications Prior to Admission medications   Medication Sig Start Date End Date Taking? Authorizing Provider  ACETAMINOPHEN PO Take 650 mg by mouth 4 (four) times daily as needed (pain).    Yes Historical Provider, MD  furosemide (LASIX) 80 MG tablet Take 80 mg by mouth. Take on non-dialysis days   Yes Historical Provider, MD  magnesium oxide (MAG-OX) 400 MG tablet Take 400 mg by mouth 2 (two) times daily.   Yes Historical Provider, MD  metoprolol succinate (TOPROL-XL) 25 MG 24 hr tablet Take 12.5 mg by mouth at bedtime.    Yes Historical Provider, MD  pantoprazole (PROTONIX) 40 MG tablet Take 40 mg by mouth daily.   Yes Historical Provider, MD   Liver Function Tests No results for input(s): AST, ALT, ALKPHOS, BILITOT, PROT, ALBUMIN in the last 168 hours. No results for input(s): LIPASE, AMYLASE in the last 168 hours. CBC  Recent Labs Lab 09/20/14 2303 09/21/14 0555  WBC 10.7* 10.1  HGB 11.2* 9.6*  HCT 32.3* 28.5*  MCV 81.8 82.1  PLT 237 218   Basic Metabolic Panel  Recent Labs Lab 09/20/14 2303 09/21/14 0555  NA 130* 131*  K 4.9 4.9  CL 94* 95*  CO2 22 24  GLUCOSE 95 76  BUN 21* 24*  CREATININE 10.55* 10.99*  CALCIUM 10.9* 10.5*  Filed Vitals:   09/21/14 0230 09/21/14 0300 09/21/14 0422 09/21/14 1132  BP: 139/69 126/69 146/75 150/80  Pulse: 89 91 89 90  Temp:   97.8 F (36.6 C) 98.4 F (36.9 C)  TempSrc:   Oral Oral  Resp: Weight:   77 kg (169 lb 12.1 oz)   SpO2: 90% 90% 95% 96%   Exam Uncomfortable, in pain w breathing and movement L chest No rash, cyanosis or gangrene Sclera anicteric, throat clear No distress +JVD Chest dec'd R base, rales L base, no wheezing RRR no MRG Abd soft ntnd no ascites Back no lesions or deformities Breast no mass/ tenderness L arm +AVF bruit (maturing, placed on 09/08/14 Neruo is alert nf, Ox 3   [name]  4h   76.5kg  3K/2.25 bath  Heparin 2000   Aranesp 80/wk Venofer 50  Assessment: 1. Dyspnea /  pulm edema / pleur effusion - prob due to vol excess. Has prob lost lean body wt.  Needs HD today. 2. ESRD on HD 3. Anemia cont Fe, asa 4. MBD cont meds 5. HTN - hold MTP while getting vol down   Plan- HD today and tomorrow, hold MTP , let BP run a little high for sake of vol removal w HD.    Vinson Moselle MD (pgr) [phone]    [phone] 09/21/2014, 1:25 PM

## 2014-09-21 NOTE — Progress Notes (Signed)
Admission note:  Arrival Method: Pt arrived to unit on stretcher with RN Mental Orientation: Alert and oriented x 4 Telemetry: Telemetry box 6e24 applied. CCMD notified Assessment: Completed, see doc flowsheets Skin: Dry and intact. No open areas noted.  IV: No IV present on admission Pain: Patient states no pain at this time Tubes: Left upper arm AV fistula. Negative for thrill and bruit. Right chest HD catheter. Dressing is clean, dry and intact.  Safety Measures: Bed in lowest position, non-slip socks placed, call light within reach. Fall Prevention Safety Plan:Reviewed with patient. Patient is a low fall risk.  Admission Screening: Completed, see doc flowsheets [phone] Orientation: Patient has been oriented to the unit, staff and to the room. Patient is lying comfortably in bed with no further needs stated at this time. Orders have been reviewed and implemented. Call light within reach, will continue to monitor.  Feliciana Rossetti, RN, BSN

## 2014-09-21 NOTE — Progress Notes (Signed)
Report received from ED RN. RN states patient will be up to the floor shortly; room prepared and ready for patient's arrival.   Feliciana Rossetti, RN, BSN

## 2014-09-21 NOTE — ED Notes (Signed)
Pt ambulated in hallway without difficulty. 02 saturation dropped to 86% with ambulation. Pulse 100-105

## 2014-09-22 ENCOUNTER — Inpatient Hospital Stay (HOSPITAL_COMMUNITY): Payer: Medicaid Other

## 2014-09-22 DIAGNOSIS — R079 Chest pain, unspecified: Secondary | ICD-10-CM

## 2014-09-22 DIAGNOSIS — Z992 Dependence on renal dialysis: Secondary | ICD-10-CM

## 2014-09-22 LAB — CBC
HEMATOCRIT: 29.4 % — AB (ref 36.0–46.0)
HEMOGLOBIN: 9.8 g/dL — AB (ref 12.0–15.0)
MCH: 27.9 pg (ref 26.0–34.0)
MCHC: 33.3 g/dL (ref 30.0–36.0)
MCV: 83.8 fL (ref 78.0–100.0)
Platelets: 208 10*3/uL (ref 150–400)
RBC: 3.51 MIL/uL — AB (ref 3.87–5.11)
RDW: 19.5 % — ABNORMAL HIGH (ref 11.5–15.5)
WBC: 7.3 10*3/uL (ref 4.0–10.5)

## 2014-09-22 LAB — BASIC METABOLIC PANEL
ANION GAP: 9 (ref 5–15)
BUN: 7 mg/dL (ref 6–20)
CALCIUM: 9.5 mg/dL (ref 8.9–10.3)
CHLORIDE: 99 mmol/L — AB (ref 101–111)
CO2: 28 mmol/L (ref 22–32)
CREATININE: 6.12 mg/dL — AB (ref 0.44–1.00)
GFR calc non Af Amer: 7 mL/min — ABNORMAL LOW (ref >60)
GFR, EST AFRICAN AMERICAN: 8 mL/min — AB (ref >60)
Glucose, Bld: 81 mg/dL (ref 65–99)
Potassium: 4.2 mmol/L (ref 3.5–5.1)
SODIUM: 136 mmol/L (ref 135–145)

## 2014-09-22 MED ORDER — OXYCODONE HCL 5 MG PO TABS
5.0000 mg | ORAL_TABLET | ORAL | Status: DC | PRN
  Filled 2014-09-22: qty 1

## 2014-09-22 NOTE — Progress Notes (Signed)
  Echocardiogram 2D Echocardiogram has been performed.  Christie Montgomery 09/22/2014, 3:56 PM

## 2014-09-22 NOTE — Progress Notes (Signed)
[facility] PROGRESS NOTE  Christie Montgomery ZOX:[id number] DOB: 01-12-1961 DOA: 09/20/2014 PCP: Garnetta Buddy, MD  Assessment/Plan: Principal Problem:   Admission for dialysis Active Problems:   Pulmonary edema   ESRD (end stage renal disease)    Chest pain-shortness of breath-could be secondary to volume excess, undergoing hemodialysis, troponin negative 3, will order 2-D echo, check CT PE protocol to rule out PE as well as Further evaluate the right-sided loculated pleural effusion, patient has not had a fever, no white count therefore hopefully this is not an empyema.  ESRD -hemodialysis in [place] on TTS schedule  Anemia-hemoglobin stable, nephrology following   Code Status:      Code Status Orders        Start     Ordered   09/21/14 0316  Full code   Continuous     09/21/14 0317     Family Communication: family updated about patient's clinical progress Disposition Plan: Anticipate discharge tomorrow if workup is negative   Brief narrative: 54 y.o. year-old with hx of HTN, HL and ESRD started HD during hospital stay at [facility] in May-June this year. Had acute renal problems per patient, was started on dialysis and has not recovered. Gets HD now in [place] on TTS schedule. Presenting with SOB, orthopnea , some leg swelling. Coughing a lot the last 3-4 days, and now has pain in L lateral upper chest , worse w coughing. Denies recent f/c/s, abd pain , n/v/d. Asked to see for dialysis. CXR showing IS edema, R effusion.   Consultants:  Nephrology  Procedures:  None  Antibiotics: Anti-infectives    None         HPI/Subjective: Still complaining of pain under her left breast, almost reproducible to palpation  Objective: Filed Vitals:   09/22/14 0745 09/22/14 0831 09/22/14 0900 09/22/14 0930  BP: 138/74 130/65 94/44 102/62  Pulse: 79 84 91 88  Temp:      TempSrc:      Resp:      Height:      Weight:      SpO2:        Intake/Output  Summary (Last 24 hours) at 09/22/14 1002 Last data filed at 09/22/14 8119  Gross per 24 hour  Intake    120 ml  Output   3050 ml  Net  -2930 ml    Exam:  General: No acute respiratory distress Lungs: Clear to auscultation bilaterally without wheezes or crackles, pain to palpation under her left breast Cardiovascular: Regular rate and rhythm without murmur gallop or rub normal S1 and S2 Abdomen: Nontender, nondistended, soft, bowel sounds positive, no rebound, no ascites, no appreciable mass Extremities: No significant cyanosis, clubbing, or edema bilateral lower extremities     Data Review   Micro Results Recent Results (from the past 240 hour(s))  MRSA PCR Screening     Status: None   Collection Time: 09/21/14  4:46 AM  Result Value Ref Range Status   MRSA by PCR NEGATIVE NEGATIVE Final    Comment:        The GeneXpert MRSA Assay (FDA approved for NASAL specimens only), is one component of a comprehensive MRSA colonization surveillance program. It is not intended to diagnose MRSA infection nor to guide or monitor treatment for MRSA infections.     Radiology Reports Dg Chest 2 View  09/20/2014   CLINICAL DATA:  Chest pain, shortness of breath  EXAM: CHEST  2 VIEW  COMPARISON:  None.  FINDINGS: There is a dual-lumen right-sided central venous catheter.  There is a small partially loculated right pleural effusion. There is right basilar airspace disease. There is bilateral interstitial thickening. There is no pneumothorax. The heart and mediastinal contours are unremarkable.  The osseous structures are unremarkable.  IMPRESSION: 1. Small partial loculated right pleural effusion with right basilar airspace disease. This may reflect atelectasis versus pneumonia. 2. Mild bilateral interstitial thickening likely reflecting mild interstitial edema.   Electronically Signed   By: Elige Ko   On: 09/20/2014 23:28     CBC  Recent Labs Lab 09/20/14 2303 09/21/14 0555  09/22/14 0530  WBC 10.7* 10.1 7.3  HGB 11.2* 9.6* 9.8*  HCT 32.3* 28.5* 29.4*  PLT 237 218 208  MCV 81.8 82.1 83.8  MCH 28.4 27.7 27.9  MCHC 34.7 33.7 33.3  RDW 18.9* 19.1* 19.5*    Chemistries   Recent Labs Lab 09/20/14 2303 09/21/14 0555 09/21/14 1848 09/22/14 0530  NA 130* 131* 138 136  K 4.9 4.9 3.7 4.2  CL 94* 95* 100* 99*  CO2 22 24 28 28   GLUCOSE 95 76 76 81  BUN 21* 24* 7 7  CREATININE 10.55* 10.99* 4.69* 6.12*  CALCIUM 10.9* 10.5* 9.2 9.5   ------------------------------------------------------------------------------------------------------------------ estimated creatinine clearance is 11.1 mL/min (by C-G formula based on Cr of 6.12). ------------------------------------------------------------------------------------------------------------------ No results for input(s): HGBA1C in the last 72 hours. ------------------------------------------------------------------------------------------------------------------ No results for input(s): CHOL, HDL, LDLCALC, TRIG, CHOLHDL, LDLDIRECT in the last 72 hours. ------------------------------------------------------------------------------------------------------------------ No results for input(s): TSH, T4TOTAL, T3FREE, THYROIDAB in the last 72 hours.  Invalid input(s): FREET3 ------------------------------------------------------------------------------------------------------------------ No results for input(s): VITAMINB12, FOLATE, FERRITIN, TIBC, IRON, RETICCTPCT in the last 72 hours.  Coagulation profile No results for input(s): INR, PROTIME in the last 168 hours.  No results for input(s): DDIMER in the last 72 hours.  Cardiac Enzymes  Recent Labs Lab 09/21/14 0555 09/21/14 1202 09/21/14 1848  TROPONINI 0.05* 0.05* 0.05*   ------------------------------------------------------------------------------------------------------------------ Invalid input(s): POCBNP   CBG: No results for input(s): GLUCAP  in the last 168 hours.     Studies: Dg Chest 2 View  09/20/2014   CLINICAL DATA:  Chest pain, shortness of breath  EXAM: CHEST  2 VIEW  COMPARISON:  None.  FINDINGS: There is a dual-lumen right-sided central venous catheter.  There is a small partially loculated right pleural effusion. There is right basilar airspace disease. There is bilateral interstitial thickening. There is no pneumothorax. The heart and mediastinal contours are unremarkable.  The osseous structures are unremarkable.  IMPRESSION: 1. Small partial loculated right pleural effusion with right basilar airspace disease. This may reflect atelectasis versus pneumonia. 2. Mild bilateral interstitial thickening likely reflecting mild interstitial edema.   Electronically Signed   By: Elige Ko   On: 09/20/2014 23:28      No results found for: HGBA1C Lab Results  Component Value Date   CREATININE 6.12* 09/22/2014       Scheduled Meds: . heparin  2,000 Units Dialysis Once in dialysis  . heparin  2,000 Units Dialysis Once in dialysis  . heparin  5,000 Units Subcutaneous 3 times per day  . magnesium oxide  400 mg Oral BID  . pantoprazole  40 mg Oral Daily   Continuous Infusions:   Principal Problem:   Admission for dialysis Active Problems:   Pulmonary edema   ESRD (end stage renal disease)    Time spent: 45 minutes   [facility]  [facility] Pager [phone]. If 7PM-7AM, please contact night-coverage  at [email or web address], password [facility] 09/22/2014, 10:02 AM  LOS: 1 day

## 2014-09-22 NOTE — Progress Notes (Signed)
   [facility] Progress Note   Subjective: Feeling better  Filed Vitals:   09/22/14 0930 09/22/14 1000 09/22/14 1030 09/22/14 1100  BP: 102/62 112/71 99/63 78/56   Pulse: 88 103 105 108  Temp:      TempSrc:      Resp:      Height:      Weight:      SpO2:       Exam: Uncomfortable No distress +JVD Chest dec'd R base, rales L base, no wheezing RRR no MRG Abd soft ntnd no ascites Back no lesions or deformities Breast no mass/ tenderness L arm +AVF bruit (maturing, placed on 09/08/14 Neruo is alert nf, Ox 3   [name] 4h 76.5kg 3K/2.25 bath Heparin 2000  Aranesp 80/wk Venofer 50  Assessment: 1. Dyspnea / vol excess / pulm edema - getting better, getting volume off, will need sig drop in dry weight 2. ESRD on HD short Rx 3. Anemia cont Fe, asa 4. MBD cont meds 5. HTN - hold MTP while getting vol down  Plan - HD in am first shift, poss dc Sat after HD    Vinson Moselle MD  pager [phone]    cell [phone]  09/22/2014, 11:21 AM     Recent Labs Lab 09/21/14 0555 09/21/14 1848 09/22/14 0530  NA 131* 138 136  K 4.9 3.7 4.2  CL 95* 100* 99*  CO2 GLUCOSE 76 76 81  BUN 24* 7 7  CREATININE 10.99* 4.69* 6.12*  CALCIUM 10.5* 9.2 9.5  PHOS  --  3.2  --     Recent Labs Lab 09/21/14 1848  ALBUMIN 2.7*    Recent Labs Lab 09/20/14 2303 09/21/14 0555 09/22/14 0530  WBC 10.7* 10.1 7.3  HGB 11.2* 9.6* 9.8*  HCT 32.3* 28.5* 29.4*  MCV 81.8 82.1 83.8  PLT 237 218 208   . heparin  2,000 Units Dialysis Once in dialysis  . heparin  2,000 Units Dialysis Once in dialysis  . heparin  5,000 Units Subcutaneous 3 times per day  . magnesium oxide  400 mg Oral BID  . pantoprazole  40 mg Oral Daily     sodium chloride, sodium chloride, sodium chloride, sodium chloride, acetaminophen, alteplase, alteplase, feeding supplement (NEPRO CARB STEADY), feeding supplement (NEPRO CARB STEADY), heparin, heparin, lidocaine (PF), lidocaine (PF),  lidocaine-prilocaine, lidocaine-prilocaine, morphine injection, ondansetron (ZOFRAN) IV, oxyCODONE, pentafluoroprop-tetrafluoroeth, pentafluoroprop-tetrafluoroeth

## 2014-09-22 NOTE — Evaluation (Signed)
Physical Therapy Evaluation Patient Details Name: Christie Montgomery MRN: [id number] DOB: 07-19-1960 Today's Date: 09/22/2014   History of Present Illness  Patient is a 54 yo female admitted 09/20/14 with SOB and chest pain.  Patient had missed HD x2.   PMH:  ESRD on HD, anemia, HTN, chronic back pain  Clinical Impression  Patient is functioning at Mod I level with all mobility and gait.  Good balance during gait and with high level balance activities.  No acute PT needs identified - PT will sign off.  Encouraged ambulation in hallway.    Follow Up Recommendations No PT follow up;Supervision for mobility/OOB    Equipment Recommendations  None recommended by PT    Recommendations for Other Services       Precautions / Restrictions Precautions Precautions: None Restrictions Weight Bearing Restrictions: No      Mobility  Bed Mobility Overal bed mobility: Modified Independent                Transfers Overall transfer level: Modified independent Equipment used: None                Ambulation/Gait Ambulation/Gait assistance: Modified independent (Device/Increase time) Ambulation Distance (Feet): 160 Feet Assistive device: None Gait Pattern/deviations: Step-through pattern;Decreased stride length Gait velocity: Decreased Gait velocity interpretation: Below normal speed for age/gender General Gait Details: Patient with slow, steady gait.  Good gait pattern.  No loss of balance.  Stairs            Wheelchair Mobility    Modified Rankin (Stroke Patients Only)       Balance Overall balance assessment: Modified Independent                           High level balance activites: Direction changes;Turns;Sudden stops;Head turns High Level Balance Comments: No loss of balance with high level balance activities             Pertinent Vitals/Pain Pain Assessment: 0-10 Pain Score: 6  Pain Location: Lt chest wall Pain Descriptors / Indicators:  Sore (when coughing) Pain Intervention(s): Monitored during session;Repositioned    Home Living Family/patient expects to be discharged to:: Private residence Living Arrangements: Spouse/significant other Available Help at Discharge: Family;Available 24 hours/day Type of Home: Apartment Home Access: Stairs to enter Entrance Stairs-Rails: Right;Left Entrance Stairs-Number of Steps: 18 Home Layout: One level Home Equipment: Walker - 2 wheels;Wheelchair - manual;Shower seat;Grab bars - toilet;Grab bars - tub/shower (tall toilets)      Prior Function Level of Independence: Independent with assistive device(s)         Comments: Uses RW on days that she "feels weak"     Hand Dominance        Extremity/Trunk Assessment   Upper Extremity Assessment: Overall WFL for tasks assessed           Lower Extremity Assessment: Generalized weakness         Communication   Communication: No difficulties  Cognition Arousal/Alertness: Awake/alert Behavior During Therapy: WFL for tasks assessed/performed Overall Cognitive Status: Within Functional Limits for tasks assessed                      General Comments      Exercises        Assessment/Plan    PT Assessment Patent does not need any further PT services  PT Diagnosis Abnormality of gait;Generalized weakness;Acute pain   PT Problem List    PT Treatment  Interventions     PT Goals (Current goals can be found in the Care Plan section) Acute Rehab PT Goals PT Goal Formulation: All assessment and education complete, DC therapy    Frequency     Barriers to discharge        Co-evaluation               End of Session   Activity Tolerance: Patient tolerated treatment well Patient left: in bed;with call bell/phone within reach (sitting EOB for lunch) Nurse Communication: Mobility status (No acute PT needs)         Time: 1610-9604 PT Time Calculation (min) (ACUTE ONLY): 14 min   Charges:   PT  Evaluation $Initial PT Evaluation Tier I: 1 Procedure     PT G CodesVena Austria 10-13-14, 2:47 PM Durenda Hurt. Renaldo Fiddler, [facility] Acute Rehab Services Pager [phone]

## 2014-09-22 NOTE — Care Management Note (Signed)
Case Management Note  Patient Details  Name: Christie Montgomery MRN: [id number] Date of Birth: 1960-12-25  Subjective/Objective:         CM following for progression and d/c planning.           Action/Plan: Chart reviewed and no d/c needs identified.   Expected Discharge Date:      09/22/2014            Expected Discharge Plan:  Home/Self Care  In-House Referral:  NA  Discharge planning Services  NA  Post Acute Care Choice:  NA Choice offered to:  NA  DME Arranged:    DME Agency:     HH Arranged:    HH Agency:     Status of Service:  Completed, signed off  Medicare Important Message Given:    Date Medicare IM Given:    Medicare IM give by:    Date Additional Medicare IM Given:    Additional Medicare Important Message give by:     If discussed at Long Length of Stay Meetings, dates discussed:    Additional Comments:  Starlyn Skeans, RN 09/22/2014, 11:53 AM

## 2014-09-23 ENCOUNTER — Inpatient Hospital Stay (HOSPITAL_COMMUNITY): Payer: Medicaid Other

## 2014-09-23 LAB — RENAL FUNCTION PANEL
ALBUMIN: 2.6 g/dL — AB (ref 3.5–5.0)
ANION GAP: 11 (ref 5–15)
BUN: <5 mg/dL — ABNORMAL LOW (ref 6–20)
CALCIUM: 9.8 mg/dL (ref 8.9–10.3)
CO2: 28 mmol/L (ref 22–32)
Chloride: 93 mmol/L — ABNORMAL LOW (ref 101–111)
Creatinine, Ser: 4.65 mg/dL — ABNORMAL HIGH (ref 0.44–1.00)
GFR, EST AFRICAN AMERICAN: 11 mL/min — AB (ref >60)
GFR, EST NON AFRICAN AMERICAN: 10 mL/min — AB (ref >60)
Glucose, Bld: 104 mg/dL — ABNORMAL HIGH (ref 65–99)
PHOSPHORUS: 3.3 mg/dL (ref 2.5–4.6)
Potassium: 3.3 mmol/L — ABNORMAL LOW (ref 3.5–5.1)
SODIUM: 132 mmol/L — AB (ref 135–145)

## 2014-09-23 LAB — CBC
HCT: 30.4 % — ABNORMAL LOW (ref 36.0–46.0)
HEMOGLOBIN: 10.1 g/dL — AB (ref 12.0–15.0)
MCH: 28 pg (ref 26.0–34.0)
MCHC: 33.2 g/dL (ref 30.0–36.0)
MCV: 84.2 fL (ref 78.0–100.0)
Platelets: 196 10*3/uL (ref 150–400)
RBC: 3.61 MIL/uL — AB (ref 3.87–5.11)
RDW: 19.4 % — ABNORMAL HIGH (ref 11.5–15.5)
WBC: 8 10*3/uL (ref 4.0–10.5)

## 2014-09-23 MED ORDER — TECHNETIUM TO 99M ALBUMIN AGGREGATED
6.0900 | Freq: Once | INTRAVENOUS | Status: AC | PRN
  Administered 2014-09-23: 6.09 via INTRAVENOUS

## 2014-09-23 MED ORDER — TECHNETIUM TC 99M DIETHYLENETRIAME-PENTAACETIC ACID: 43.0000 | Freq: Once | INTRAVENOUS | Status: DC | PRN

## 2014-09-23 NOTE — Progress Notes (Signed)
  [facility] Progress Note   Subjective: Feeling better  Filed Vitals:   09/23/14 0941 09/23/14 0946 09/23/14 0952 09/23/14 1029  BP: 116/63 106/60 107/54 111/59  Pulse: 90 93 97 93  Temp:   97.7 F (36.5 C) 98.3 F (36.8 C)  TempSrc:    Oral  Resp: Height:      Weight:   68.9 kg (151 lb 14.4 oz)   SpO2:   98% 92%   Exam: Alert, no distress Chest clear bilat RRR no MRG Abd soft ntnd no ascites Back no lesions or deformities Breast no mass/ tenderness L arm +AVF bruit (maturing, placed on 09/08/14) Neruo is alert nf, Ox 3  [name] 4h 76.5kg 3K/2.25 bath Heparin 2000  Aranesp 80/wk Venofer 50  Assessment: 1. Pulm edema - loss of lean body mass, dry wt lowered to 69.5kg 2. ESRD on HD 3. Anemia cont Fe, asa 4. MBD cont meds 5. HTN   Plan - dc home    Vinson Moselle MD  pager [phone]    cell [phone]  09/23/2014, 1:03 PM     Recent Labs Lab 09/21/14 1848 09/22/14 0530 09/23/14 0731  NA 138 136 132*  K 3.7 4.2 3.3*  CL 100* 99* 93*  CO2 GLUCOSE 76 81 104*  BUN 7 7 <5*  CREATININE 4.69* 6.12* 4.65*  CALCIUM 9.2 9.5 9.8  PHOS 3.2  --  3.3    Recent Labs Lab 09/21/14 1848 09/23/14 0731  ALBUMIN 2.7* 2.6*    Recent Labs Lab 09/21/14 0555 09/22/14 0530 09/23/14 0732  WBC 10.1 7.3 8.0  HGB 9.6* 9.8* 10.1*  HCT 28.5* 29.4* 30.4*  MCV 82.1 83.8 84.2  PLT 218 208 196   . heparin  5,000 Units Subcutaneous 3 times per day  . magnesium oxide  400 mg Oral BID  . pantoprazole  40 mg Oral Daily     acetaminophen, morphine injection, ondansetron (ZOFRAN) IV, oxyCODONE, technetium TC 22M diethylenetriame-pentaacetic acid

## 2014-09-23 NOTE — Progress Notes (Signed)
Christie Montgomery to be D/C'd Home per MD order.  Discussed prescriptions and follow up appointments with the patient. Prescriptions given to patient, medication list explained in detail. Pt verbalized understanding.    Medication List    TAKE these medications        ACETAMINOPHEN PO  Take 650 mg by mouth 4 (four) times daily as needed (pain).     furosemide 80 MG tablet  Commonly known as:  LASIX  Take 80 mg by mouth. Take on non-dialysis days     magnesium oxide 400 MG tablet  Commonly known as:  MAG-OX  Take 400 mg by mouth 2 (two) times daily.     metoprolol succinate 25 MG 24 hr tablet  Commonly known as:  TOPROL-XL  Take 12.5 mg by mouth at bedtime.     pantoprazole 40 MG tablet  Commonly known as:  PROTONIX  Take 40 mg by mouth daily.        Filed Vitals:   09/23/14 1029  BP: 111/59  Pulse: 93  Temp: 98.3 F (36.8 C)  Resp: 18    Skin clean, dry and intact without evidence of skin break down, no evidence of skin tears noted. IV catheter discontinued intact. Site without signs and symptoms of complications. Dressing and pressure applied. Pt denies pain at this time. No complaints noted.  An After Visit Summary was printed and given to the patient. Patient escorted via WC, and D/C home via private auto.  [employer], RN-BC, [employer] [facility] [address] Phone [phone]

## 2014-09-23 NOTE — Discharge Summary (Signed)
Physician Discharge Summary  Christie Montgomery MRN: [id number] DOB/AGE: 1960/11/10 54 y.o.  PCP: Sherril Croon, MD   Admit date: 09/20/2014 Discharge date: 09/23/2014  Discharge Diagnoses:   Principal Problem:   Admission for dialysis Active Problems:   Pulmonary edema   ESRD (end stage renal disease)    Follow-up recommendations Follow-up with PCP in 3-5 days , including all  additional recommended appointments as below Follow-up CBC, CMP in 3-5 days      Medication List    TAKE these medications        ACETAMINOPHEN PO  Take 650 mg by mouth 4 (four) times daily as needed (pain).     furosemide 80 MG tablet  Commonly known as:  LASIX  Take 80 mg by mouth. Take on non-dialysis days     magnesium oxide 400 MG tablet  Commonly known as:  MAG-OX  Take 400 mg by mouth 2 (two) times daily.     metoprolol succinate 25 MG 24 hr tablet  Commonly known as:  TOPROL-XL  Take 12.5 mg by mouth at bedtime.     pantoprazole 40 MG tablet  Commonly known as:  PROTONIX  Take 40 mg by mouth daily.         Discharge Condition: Stable  Disposition: 01-Home or Self Care   Consults: *  Nephrology  Significant Diagnostic Studies:  Dg Chest 2 View  09/20/2014   CLINICAL DATA:  Chest pain, shortness of breath  EXAM: CHEST  2 VIEW  COMPARISON:  None.  FINDINGS: There is a dual-lumen right-sided central venous catheter.  There is a small partially loculated right pleural effusion. There is right basilar airspace disease. There is bilateral interstitial thickening. There is no pneumothorax. The heart and mediastinal contours are unremarkable.  The osseous structures are unremarkable.  IMPRESSION: 1. Small partial loculated right pleural effusion with right basilar airspace disease. This may reflect atelectasis versus pneumonia. 2. Mild bilateral interstitial thickening likely reflecting mild interstitial edema.   Electronically Signed   By: Kathreen Devoid   On: 09/20/2014 23:28   Nm  Pulmonary Perf And Vent  09/23/2014   CLINICAL DATA:  Left-sided chest pain with difficulty breathing.  EXAM: NUCLEAR MEDICINE VENTILATION - PERFUSION LUNG SCAN  TECHNIQUE: Ventilation images were obtained in multiple projections using inhaled aerosol Tc-59mDTPA. Perfusion images were obtained in multiple projections after intravenous injection of Tc-964mAA.  RADIOPHARMACEUTICALS:  43.0 Technetium-9920mPA aerosol inhalation and 6.09 Technetium-9m9m IV  COMPARISON:  Chest radiograph 1 day prior.  FINDINGS: Ventilation: No focal ventilation defect, ventilation is diffusely patchy.  Perfusion: No wedge shaped peripheral perfusion defects to suggest acute pulmonary embolism. Small matched defect in the right upper lobe.  IMPRESSION: Very low probability for pulmonary embolus.   Electronically Signed   By: MelaJeb Levering.   On: 09/23/2014 02:58   Dg Chest Port 1 View  09/22/2014   CLINICAL DATA:  Dyspnea. Left-sided chest pain. Missed several dialysis treatments.  EXAM: PORTABLE CHEST - 1 VIEW  COMPARISON:  Frontal and lateral views 09/20/2014  FINDINGS: Worsening hazy opacity at the right lung base, concerning for increased pleural effusion. There is associated right lower lobe opacity. Trace blunting of left costophrenic angle without large left pleural effusion. Minimal atelectasis at the left lung base. Mild cardiomegaly, likely accentuated by technique. Pulmonary edema is unchanged. Tip of the right dialysis catheter at the atrial caval junction. No pneumothorax. There is a fracture of left lateral eighth or ninth rib with surrounding  callus formation.  IMPRESSION: 1. Worsening right pleural effusion and basilar airspace disease, likely compressive atelectasis. Pneumonia is not entirely excluded based on radiographic findings alone. 2. Unchanged pulmonary edema. 3. Left lateral rib fracture with callus formation. This appears at least subacute, and may be chronic.   Electronically Signed   By:  Jeb Levering M.D.   On: 09/22/2014 21:15        Filed Weights   09/22/14 2041 09/23/14 0715 09/23/14 0952  Weight: 68.992 kg (152 lb 1.6 oz) 69.9 kg (154 lb 1.6 oz) 68.9 kg (151 lb 14.4 oz)     Microbiology: Recent Results (from the past 240 hour(s))  MRSA PCR Screening     Status: None   Collection Time: 09/21/14  4:46 AM  Result Value Ref Range Status   MRSA by PCR NEGATIVE NEGATIVE Final    Comment:        The GeneXpert MRSA Assay (FDA approved for NASAL specimens only), is one component of a comprehensive MRSA colonization surveillance program. It is not intended to diagnose MRSA infection nor to guide or monitor treatment for MRSA infections.        Blood Culture No results found for: SDES, [place], CULT, REPTSTATUS    Labs: Results for orders placed or performed during the hospital encounter of 09/20/14 (from the past 48 hour(s))  Troponin I     Status: Abnormal   Collection Time: 09/21/14  6:48 PM  Result Value Ref Range   Troponin I 0.05 (H) <0.031 ng/mL    Comment:        PERSISTENTLY INCREASED TROPONIN VALUES IN THE RANGE OF 0.04-0.49 ng/mL CAN BE SEEN IN:       -UNSTABLE ANGINA       -CONGESTIVE HEART FAILURE       -MYOCARDITIS       -CHEST TRAUMA       -ARRYHTHMIAS       -LATE PRESENTING MYOCARDIAL INFARCTION       -COPD   CLINICAL FOLLOW-UP RECOMMENDED.   Renal function panel     Status: Abnormal   Collection Time: 09/21/14  6:48 PM  Result Value Ref Range   Sodium 138 135 - 145 mmol/L    Comment: DELTA CHECK NOTED   Potassium 3.7 3.5 - 5.1 mmol/L    Comment: DELTA CHECK NOTED   Chloride 100 (L) 101 - 111 mmol/L   CO2 28 22 - 32 mmol/L   Glucose, Bld 76 65 - 99 mg/dL   BUN 7 6 - 20 mg/dL   Creatinine, Ser 4.69 (H) 0.44 - 1.00 mg/dL    Comment: DELTA CHECK NOTED   Calcium 9.2 8.9 - 10.3 mg/dL   Phosphorus 3.2 2.5 - 4.6 mg/dL   Albumin 2.7 (L) 3.5 - 5.0 g/dL   GFR calc non Af Amer 10 (L) >60 mL/min   GFR calc Af Amer 11 (L)  >60 mL/min    Comment: (NOTE) The eGFR has been calculated using the CKD EPI equation. This calculation has not been validated in all clinical situations. eGFR's persistently <60 mL/min signify possible Chronic Kidney Disease.    Anion gap 10 5 - 15  CBC     Status: Abnormal   Collection Time: 09/22/14  5:30 AM  Result Value Ref Range   WBC 7.3 4.0 - 10.5 K/uL   RBC 3.51 (L) 3.87 - 5.11 MIL/uL   Hemoglobin 9.8 (L) 12.0 - 15.0 g/dL   HCT 29.4 (L) 36.0 - 46.0 %  MCV 83.8 78.0 - 100.0 fL   MCH 27.9 26.0 - 34.0 pg   MCHC 33.3 30.0 - 36.0 g/dL   RDW 19.5 (H) 11.5 - 15.5 %   Platelets 208 150 - 400 K/uL  Basic metabolic panel     Status: Abnormal   Collection Time: 09/22/14  5:30 AM  Result Value Ref Range   Sodium 136 135 - 145 mmol/L   Potassium 4.2 3.5 - 5.1 mmol/L   Chloride 99 (L) 101 - 111 mmol/L   CO2 28 22 - 32 mmol/L   Glucose, Bld 81 65 - 99 mg/dL   BUN 7 6 - 20 mg/dL   Creatinine, Ser 6.12 (H) 0.44 - 1.00 mg/dL   Calcium 9.5 8.9 - 10.3 mg/dL   GFR calc non Af Amer 7 (L) >60 mL/min   GFR calc Af Amer 8 (L) >60 mL/min    Comment: (NOTE) The eGFR has been calculated using the CKD EPI equation. This calculation has not been validated in all clinical situations. eGFR's persistently <60 mL/min signify possible Chronic Kidney Disease.    Anion gap 9 5 - 15  Renal function panel     Status: Abnormal   Collection Time: 09/23/14  7:31 AM  Result Value Ref Range   Sodium 132 (L) 135 - 145 mmol/L   Potassium 3.3 (L) 3.5 - 5.1 mmol/L   Chloride 93 (L) 101 - 111 mmol/L   CO2 28 22 - 32 mmol/L   Glucose, Bld 104 (H) 65 - 99 mg/dL   BUN <5 (L) 6 - 20 mg/dL    Comment: RESULT REPEATED AND VERIFIED   Creatinine, Ser 4.65 (H) 0.44 - 1.00 mg/dL   Calcium 9.8 8.9 - 10.3 mg/dL   Phosphorus 3.3 2.5 - 4.6 mg/dL   Albumin 2.6 (L) 3.5 - 5.0 g/dL   GFR calc non Af Amer 10 (L) >60 mL/min   GFR calc Af Amer 11 (L) >60 mL/min    Comment: (NOTE) The eGFR has been calculated using the  CKD EPI equation. This calculation has not been validated in all clinical situations. eGFR's persistently <60 mL/min signify possible Chronic Kidney Disease.    Anion gap 11 5 - 15  CBC     Status: Abnormal   Collection Time: 09/23/14  7:32 AM  Result Value Ref Range   WBC 8.0 4.0 - 10.5 K/uL   RBC 3.61 (L) 3.87 - 5.11 MIL/uL   Hemoglobin 10.1 (L) 12.0 - 15.0 g/dL   HCT 30.4 (L) 36.0 - 46.0 %   MCV 84.2 78.0 - 100.0 fL   MCH 28.0 26.0 - 34.0 pg   MCHC 33.2 30.0 - 36.0 g/dL   RDW 19.4 (H) 11.5 - 15.5 %   Platelets 196 150 - 400 K/uL     Lipid Panel  No results found for: CHOL, TRIG, HDL, CHOLHDL, VLDL, LDLCALC, LDLDIRECT   No results found for: HGBA1C   Lab Results  Component Value Date   CREATININE 4.65* 09/23/2014     HPI :*The patient is a 54 y.o. year-old with hx of HTN, HL and ESRD started HD during hospital stay at [facility] in May-June this year. Had acute renal problems per patient, was started on dialysis and has not recovered. Gets HD now in [place] on TTS schedule. Presenting with SOB, orthopnea , some leg swelling. Coughing a lot the last 3-4 days, and now has pain in L lateral upper chest , worse w coughing. Denies recent f/c/s, abd pain ,  n/v/d. Asked to see for dialysis. CXR showing IS edema, R effusion.   HOSPITAL COURSE:   Chest pain-shortness of breath-could be secondary to volume excess, undergoing hemodialysis, troponin negative 3, 2-D echo shows any over 64-31%, grade 1 diastolic heart failure,, . VQ scan low probability for PE. Patient was found to have a small right-sided loculated pleural effusion, patient has not had a fever, no white count therefore hopefully this is not an empyema. Would recommend CT scan if the patient develops fever or leukocytosis  ESRD -hemodialysis in [place] on TTS schedule  Anemia-hemoglobin stable,   Discharge Exam:  Blood pressure 111/59, pulse 93, temperature 98.3 F (36.8 C), temperature source Oral, resp. rate 18,  height 5' 6"  (1.676 m), weight 68.9 kg (151 lb 14.4 oz), SpO2 92 %.   General: No acute respiratory distress Lungs: Clear to auscultation bilaterally without wheezes or crackles, pain to palpation under her left breast Cardiovascular: Regular rate and rhythm without murmur gallop or rub normal S1 and S2 Abdomen: Nontender, nondistended, soft, bowel sounds positive, no rebound, no ascites, no appreciable mass Extremities: No significant cyanosis, clubbing, or edema bilateral lower extremities      Discharge Instructions    Diet - low sodium heart healthy    Complete by:  As directed      Increase activity slowly    Complete by:  As directed            Follow-up Information    Follow up with Sherril Croon, MD. Schedule an appointment as soon as possible for a visit in 3 days.   Specialty:  Nephrology   Contact information:   [facility] [phone]       Signed: Reyne Dumas 09/23/2014, 1:18 PM        Time spent >45 mins

## 2014-09-25 LAB — HEPATITIS B SURFACE ANTIGEN: HEP B S AG: NEGATIVE

## 2014-10-10 ENCOUNTER — Encounter: Payer: Self-pay | Admitting: Vascular Surgery

## 2014-10-11 ENCOUNTER — Other Ambulatory Visit: Payer: Self-pay

## 2014-10-11 ENCOUNTER — Encounter: Payer: Self-pay | Admitting: Vascular Surgery

## 2014-10-11 ENCOUNTER — Ambulatory Visit (INDEPENDENT_AMBULATORY_CARE_PROVIDER_SITE_OTHER): Payer: Self-pay | Admitting: Vascular Surgery

## 2014-10-11 VITALS — BP 116/73 | HR 90 | Ht 66.0 in | Wt 153.0 lb

## 2014-10-11 DIAGNOSIS — N186 End stage renal disease: Secondary | ICD-10-CM

## 2014-10-11 NOTE — Progress Notes (Signed)
[facility] and [facility] Specialist of [facility]  Patient name: Christie Montgomery MRN: [id number] DOB: 1960-10-17 Sex: female  REASON FOR VISIT: clotted AV fistula  HPI: Christie Montgomery is a 54 y.o. female  Performed a left brachiocephalic fistula on on 09/08/2014. She was noted to have a 4 mm upper arm cephalic vein. She comes in today with a clotted left upper arm fistula. She is unaware of any problems with hypotension. She is dialyzing with her right IJ tunneled dialysis catheter.  Past Medical History  Diagnosis Date  . Hyperlipidemia   . Anemia   . Hypertension   . Chronic kidney disease   . Coagulation defect   . Kidney stones   . Pneumonia   . ESRD (end stage renal disease) 09/21/2014   Family History  Problem Relation Age of Onset  . Cancer Mother    SOCIAL HISTORY: Social History  Substance Use Topics  . Smoking status: Current Every Day Smoker -- 0.25 packs/day    Types: Cigarettes  . Smokeless tobacco: Never Used  . Alcohol Use: No   Allergies  Allergen Reactions  . Ampicillin Other (See Comments)    Patient states per office notes from [facility] that it makes her gut bleed and swell  . Hepatitis B Virus Vaccine Hives  . Penicillins Hives  . Oxycodone Nausea And Vomiting  . Tramadol Nausea And Vomiting   Current Outpatient Prescriptions  Medication Sig Dispense Refill  . ACETAMINOPHEN PO Take 650 mg by mouth 4 (four) times daily as needed (pain).     . furosemide (LASIX) 80 MG tablet Take 80 mg by mouth. Take on non-dialysis days    . metoprolol succinate (TOPROL-XL) 25 MG 24 hr tablet Take 12.5 mg by mouth at bedtime.     . pantoprazole (PROTONIX) 40 MG tablet Take 40 mg by mouth daily.    . magnesium oxide (MAG-OX) 400 MG tablet Take 400 mg by mouth 2 (two) times daily.     No current facility-administered medications for this visit.   REVIEW OF SYSTEMS: Arly.Keller ] denotes positive finding; [  ] denotes negative finding  CARDIOVASCULAR:   chest  pain    chest pressure    palpitations    orthopnea    dyspnea on exertion    claudication    rest pain    DVT    phlebitis PULMONARY:    productive cough    asthma    wheezing NEUROLOGIC:    weakness   paresthesias   aphasia   amaurosis   dizziness HEMATOLOGIC:    bleeding problems    clotting disorders MUSCULOSKELETAL:   joint pain    joint swelling  leg swelling GASTROINTESTINAL:   blood in stool    hematemesis GENITOURINARY:    dysuria    hematuria PSYCHIATRIC:   history of major depression INTEGUMENTARY:   rashes   ulcers CONSTITUTIONAL:   fever    chills  PHYSICAL EXAM: Filed Vitals:   10/11/14 1330  BP: 116/73  Pulse: 90  Height:  (1.676 m)  Weight: 153 lb (69.4 kg)  SpO2: 98%   GENERAL: The patient is a well-nourished female, in no acute distress. The vital signs are documented above. CARDIAC: There is a regular rate and rhythm.  VASCULAR: Her left upper arm fistula is occluded.  There is no bruit or thrill. PULMONARY: There is good air exchange bilaterally without wheezing or rales. ABDOMEN: Soft and non-tender with normal pitched bowel sounds.  MUSCULOSKELETAL: There are no major deformities or cyanosis. NEUROLOGIC: No focal weakness or paresthesias are detected. SKIN: There are no ulcers or rashes noted. PSYCHIATRIC: The patient has a normal affect.  DATA:  I reviewed her previous vein map. She may potentially be a candidate for basilic vein transposition on the left.  MEDICAL ISSUES: CLOTTED LEFT BRACHIOCEPHALIC AV FISTULA: I think her next best option would be a basilic vein transposition on the left or an AV graft. She dialyzes on Tuesdays Thursdays and Saturdays. I have scheduled her surgery for 10/20/2014. We have discussed the procedure potential palpitations and she is agreeable to proceed.    [name] [facility] [place]:  [phone]

## 2014-10-12 ENCOUNTER — Encounter: Payer: Self-pay | Admitting: Nephrology

## 2014-10-19 ENCOUNTER — Encounter (HOSPITAL_COMMUNITY): Payer: Self-pay | Admitting: *Deleted

## 2014-10-19 NOTE — Progress Notes (Signed)
SDW- Pre-op call completed by pt and pt spouse, Mellody Dance,  with pt consent. Pt denies SOB, chest pain, and being under the care of a cardiologist. Pt denies having a stress test and cardiac cath but stated, " I was told that I had a small heart attack in June when I was at [facility] being treated for Pneumonia, that is when my kidneys stopped working,  the cardiologist said that everything was okay when I was discharged."  Records requested from [facility]. Pt made aware to stop taking otc vitamins, herbal medications and NSAID's. Pt and spouse verbalized understanding of all pre-op instructions.

## 2014-10-20 ENCOUNTER — Ambulatory Visit (HOSPITAL_COMMUNITY)
Admission: RE | Admit: 2014-10-20 | Discharge: 2014-10-20 | Disposition: A | Discharge status: Home / Self Care | Payer: Medicaid Other | Source: Ambulatory Visit | Attending: Vascular Surgery | Admitting: Vascular Surgery

## 2014-10-20 ENCOUNTER — Encounter (HOSPITAL_COMMUNITY)
Admission: RE | Disposition: A | Discharge status: Home / Self Care | Payer: Self-pay | Source: Ambulatory Visit | Attending: Vascular Surgery

## 2014-10-20 ENCOUNTER — Ambulatory Visit (HOSPITAL_COMMUNITY): Payer: Medicaid Other | Admitting: Critical Care Medicine

## 2014-10-20 ENCOUNTER — Encounter (HOSPITAL_COMMUNITY): Payer: Self-pay | Admitting: *Deleted

## 2014-10-20 DIAGNOSIS — Z5309 Procedure and treatment not carried out because of other contraindication: Secondary | ICD-10-CM | POA: Diagnosis not present

## 2014-10-20 DIAGNOSIS — N186 End stage renal disease: Secondary | ICD-10-CM | POA: Diagnosis not present

## 2014-10-20 DIAGNOSIS — Z79899 Other long term (current) drug therapy: Secondary | ICD-10-CM | POA: Insufficient documentation

## 2014-10-20 DIAGNOSIS — Y832 Surgical operation with anastomosis, bypass or graft as the cause of abnormal reaction of the patient, or of later complication, without mention of misadventure at the time of the procedure: Secondary | ICD-10-CM | POA: Insufficient documentation

## 2014-10-20 DIAGNOSIS — I252 Old myocardial infarction: Secondary | ICD-10-CM | POA: Insufficient documentation

## 2014-10-20 DIAGNOSIS — Z992 Dependence on renal dialysis: Secondary | ICD-10-CM | POA: Diagnosis not present

## 2014-10-20 DIAGNOSIS — I120 Hypertensive chronic kidney disease with stage 5 chronic kidney disease or end stage renal disease: Secondary | ICD-10-CM | POA: Insufficient documentation

## 2014-10-20 DIAGNOSIS — D689 Coagulation defect, unspecified: Secondary | ICD-10-CM | POA: Insufficient documentation

## 2014-10-20 DIAGNOSIS — E785 Hyperlipidemia, unspecified: Secondary | ICD-10-CM | POA: Diagnosis not present

## 2014-10-20 DIAGNOSIS — T82868A Thrombosis of vascular prosthetic devices, implants and grafts, initial encounter: Secondary | ICD-10-CM | POA: Diagnosis present

## 2014-10-20 DIAGNOSIS — F17210 Nicotine dependence, cigarettes, uncomplicated: Secondary | ICD-10-CM | POA: Diagnosis not present

## 2014-10-20 HISTORY — DX: Acute myocardial infarction, unspecified: I21.9

## 2014-10-20 HISTORY — DX: Phlebitis and thrombophlebitis of unspecified site: I80.9

## 2014-10-20 LAB — POCT I-STAT 4, (NA,K, GLUC, HGB,HCT)
Glucose, Bld: 83 mg/dL (ref 65–99)
HCT: 38 % (ref 36.0–46.0)
Hemoglobin: 12.9 g/dL (ref 12.0–15.0)
Potassium: 4.5 mmol/L (ref 3.5–5.1)
SODIUM: 132 mmol/L — AB (ref 135–145)

## 2014-10-20 SURGERY — TRANSPOSITION, VEIN, BASILIC
Anesthesia: General | Laterality: Left

## 2014-10-20 MED ORDER — CALCIUM CHLORIDE 10 % IV SOLN
INTRAVENOUS | Status: AC
  Filled 2014-10-20: qty 10

## 2014-10-20 MED ORDER — CHLORHEXIDINE GLUCONATE CLOTH 2 % EX PADS: 6.0000 | MEDICATED_PAD | Freq: Once | CUTANEOUS | Status: DC

## 2014-10-20 MED ORDER — 0.9 % SODIUM CHLORIDE (POUR BTL) OPTIME: TOPICAL | Status: DC | PRN

## 2014-10-20 MED ORDER — FENTANYL CITRATE (PF) 100 MCG/2ML IJ SOLN
INTRAMUSCULAR | Status: AC
  Administered 2014-10-20: 25 ug via INTRAVENOUS
  Administered 2014-10-20: 50 ug via INTRAVENOUS
  Administered 2014-10-20: 25 ug via INTRAVENOUS
  Filled 2014-10-20: qty 2

## 2014-10-20 MED ORDER — MIDAZOLAM HCL 2 MG/2ML IJ SOLN
INTRAMUSCULAR | Status: AC
  Filled 2014-10-20: qty 4

## 2014-10-20 MED ORDER — SODIUM CHLORIDE 0.9 % IJ SOLN
INTRAMUSCULAR | Status: AC
  Filled 2014-10-20: qty 10

## 2014-10-20 MED ORDER — LIDOCAINE HCL (PF) 1 % IJ SOLN
INTRAMUSCULAR | Status: AC
  Filled 2014-10-20: qty 30

## 2014-10-20 MED ORDER — VANCOMYCIN HCL IN DEXTROSE 1-5 GM/200ML-% IV SOLN
1000.0000 mg | INTRAVENOUS | Status: AC
  Administered 2014-10-20: 1000 mg via INTRAVENOUS
  Filled 2014-10-20: qty 200

## 2014-10-20 MED ORDER — SODIUM CHLORIDE 0.9 % IV SOLN
INTRAVENOUS | Status: DC
  Administered 2014-10-20: 07:00:00 via INTRAVENOUS

## 2014-10-20 MED ORDER — EPHEDRINE SULFATE 50 MG/ML IJ SOLN
INTRAMUSCULAR | Status: AC
  Filled 2014-10-20: qty 1

## 2014-10-20 MED ORDER — FENTANYL CITRATE (PF) 250 MCG/5ML IJ SOLN
INTRAMUSCULAR | Status: AC
  Filled 2014-10-20: qty 5

## 2014-10-20 MED ORDER — SODIUM CHLORIDE 0.9 % IV SOLN: INTRAVENOUS | Status: DC | PRN

## 2014-10-20 MED ORDER — PROPOFOL 10 MG/ML IV BOLUS
INTRAVENOUS | Status: AC
  Filled 2014-10-20: qty 20

## 2014-10-20 MED ORDER — MIDAZOLAM HCL 2 MG/2ML IJ SOLN
INTRAMUSCULAR | Status: AC
  Administered 2014-10-20: 1 mg via INTRAVENOUS
  Administered 2014-10-20: 0.5 mg via INTRAVENOUS
  Filled 2014-10-20: qty 2

## 2014-10-20 NOTE — Interval H&P Note (Signed)
History and Physical Interval Note:  10/20/2014 10:48 AM  Christie Montgomery  has presented today for surgery, with the diagnosis of End Stage Renal Disease N18.6  The various methods of treatment have been discussed with the patient and family. After consideration of risks, benefits and other options for treatment, the patient has consented to  Procedure(s): BASILIC VEIN TRANSPOSITION VERSUS ARTERIOVENOUS GRAFT INSERTION (Left) as a surgical intervention .  The patient's history has been reviewed, patient examined, no change in status, stable for surgery.  I have reviewed the patient's chart and labs.  Questions were answered to the patient's satisfaction.     [name]

## 2014-10-20 NOTE — Anesthesia Preprocedure Evaluation (Signed)
Anesthesia Evaluation  Patient identified by MRN, date of birth, ID band Patient awake    Reviewed: Allergy & Precautions, NPO status , Patient's Chart, lab work & pertinent test results, reviewed documented beta blocker date and time   Airway        Dental  (+) Dental Advisory Given   Pulmonary Current Smoker,           Cardiovascular hypertension, Pt. on medications and Pt. on home beta blockers + Past MI       Neuro/Psych    GI/Hepatic   Endo/Other    Renal/GU ESRF and DialysisRenal disease     Musculoskeletal   Abdominal   Peds  Hematology   Anesthesia Other Findings   Reproductive/Obstetrics                             Anesthesia Physical Anesthesia Plan  ASA: III  Anesthesia Plan: General   Post-op Pain Management:    Induction: Intravenous  Airway Management Planned: LMA  Additional Equipment:   Intra-op Plan:   Post-operative Plan: Extubation in OR  Informed Consent: I have reviewed the patients History and Physical, chart, labs and discussed the procedure including the risks, benefits and alternatives for the proposed anesthesia with the patient or authorized representative who has indicated his/her understanding and acceptance.   Dental advisory given  Plan Discussed with: Anesthesiologist and Surgeon  Anesthesia Plan Comments: (Dr. Edilia Bo prefers GA)        Anesthesia Quick Evaluation

## 2014-10-20 NOTE — Progress Notes (Signed)
Due to pain issues and per Dr. Edilia Bo, pt cancelled for OR today. Pam at OR desk aware. IV removed and pt DC home via wheelchair accompanied by husband with personal belongings. Condition stable at time of discharge home.

## 2014-10-20 NOTE — H&P (View-Only) (Signed)
 [facility]  Patient name: Christie Montgomery MRN: [id number] DOB: 02/24/1960 Sex: female  REASON FOR VISIT: clotted AV fistula  HPI: Christie Montgomery is a 54 y.o. female  Performed a left brachiocephalic fistula on on 09/08/2014. She was noted to have a 4 mm upper arm cephalic vein. She comes in today with a clotted left upper arm fistula. She is unaware of any problems with hypotension. She is dialyzing with her right IJ tunneled dialysis catheter.  Past Medical History  Diagnosis Date  . Hyperlipidemia   . Anemia   . Hypertension   . Chronic kidney disease   . Coagulation defect   . Kidney stones   . Pneumonia   . ESRD (end stage renal disease) 09/21/2014   Family History  Problem Relation Age of Onset  . Cancer Mother    SOCIAL HISTORY: Social History  Substance Use Topics  . Smoking status: Current Every Day Smoker -- 0.25 packs/day    Types: Cigarettes  . Smokeless tobacco: Never Used  . Alcohol Use: No   Allergies  Allergen Reactions  . Ampicillin Other (See Comments)    Patient states per office notes from [facility] that it makes her gut bleed and swell  . Hepatitis B Virus Vaccine Hives  . Penicillins Hives  . Oxycodone Nausea And Vomiting  . Tramadol Nausea And Vomiting   Current Outpatient Prescriptions  Medication Sig Dispense Refill  . ACETAMINOPHEN PO Take 650 mg by mouth 4 (four) times daily as needed (pain).     . furosemide (LASIX) 80 MG tablet Take 80 mg by mouth. Take on non-dialysis days    . metoprolol succinate (TOPROL-XL) 25 MG 24 hr tablet Take 12.5 mg by mouth at bedtime.     . pantoprazole (PROTONIX) 40 MG tablet Take 40 mg by mouth daily.    . magnesium oxide (MAG-OX) 400 MG tablet Take 400 mg by mouth 2 (two) times daily.     No current facility-administered medications for this visit.   REVIEW OF SYSTEMS: [X ] denotes positive finding; [  ] denotes negative finding  CARDIOVASCULAR:  [ ] chest  pain   [ ] chest pressure   [ ] palpitations   [ ] orthopnea   [ ] dyspnea on exertion   [ ] claudication   [ ] rest pain   [ ] DVT   [ ] phlebitis PULMONARY:   [ ] productive cough   [ ] asthma   [ ] wheezing NEUROLOGIC:   [ ] weakness  [ ] paresthesias  [ ] aphasia  [ ] amaurosis  [ ] dizziness HEMATOLOGIC:   [ ] bleeding problems   [ ] clotting disorders MUSCULOSKELETAL:  [ ] joint pain   [ ] joint swelling [ ] leg swelling GASTROINTESTINAL: [ ]  blood in stool  [ ]  hematemesis GENITOURINARY:  [ ]  dysuria  [ ]  hematuria PSYCHIATRIC:  [ ] history of major depression INTEGUMENTARY:  [ ] rashes  [ ] ulcers CONSTITUTIONAL:  [ ] fever   [ ] chills  PHYSICAL EXAM: Filed Vitals:   10/11/14 1330  BP: 116/73  Pulse: 90  Height: 5' 6" (1.676 m)  Weight: 153 lb (69.4 kg)  SpO2: 98%   GENERAL: The patient is a well-nourished female, in no acute distress. The vital signs are documented above. CARDIAC: There is a regular rate and rhythm.  VASCULAR: Her left upper arm fistula is occluded.   There is no bruit or thrill. PULMONARY: There is good air exchange bilaterally without wheezing or rales. ABDOMEN: Soft and non-tender with normal pitched bowel sounds.  MUSCULOSKELETAL: There are no major deformities or cyanosis. NEUROLOGIC: No focal weakness or paresthesias are detected. SKIN: There are no ulcers or rashes noted. PSYCHIATRIC: The patient has a normal affect.  DATA:  I reviewed her previous vein map. She may potentially be a candidate for basilic vein transposition on the left.  MEDICAL ISSUES: CLOTTED LEFT BRACHIOCEPHALIC AV FISTULA: I think her next best option would be a basilic vein transposition on the left or an AV graft. She dialyzes on Tuesdays Thursdays and Saturdays. I have scheduled her surgery for 10/20/2014. We have discussed the procedure potential palpitations and she is agreeable to proceed.    [name] [facility] [place] Beeper:  [phone]

## 2014-10-25 ENCOUNTER — Encounter (HOSPITAL_COMMUNITY): Payer: Medicaid Other

## 2014-10-25 ENCOUNTER — Encounter: Payer: Medicaid Other | Admitting: Vascular Surgery

## 2014-12-28 DEATH — deceased

## 2015-03-05 ENCOUNTER — Telehealth: Payer: Self-pay

## 2015-03-05 NOTE — Telephone Encounter (Signed)
Call placed to pt. To reschedule Left arm BVT, that had been initially scheduled in September.  Spoke with pt's husband; stated the pt. Passed away last January 07, 2023.  Reported she had been in [facility] , then placed on Hospice care in [place], and passed away 2014-12-21.  Offered condolences;  informed husband will note that she has expired in the EMR.

## 2016-03-24 IMAGING — NM NM PULMONARY VENT & PERF
16 series · 16 of 16 positions shown · non-contrast
Comparison: Chest radiograph 1 day prior.

CLINICAL DATA: Left-sided chest pain with difficulty breathing.

EXAM:
NUCLEAR MEDICINE VENTILATION - PERFUSION LUNG SCAN
TECHNIQUE: Ventilation images were obtained in multiple projections using
inhaled aerosol Tc-JJm DTPA. Perfusion images were obtained in
multiple projections after intravenous injection of Tc-JJm MAA.
RADIOPHARMACEUTICALS:  43.0 7echnetium-YYm DTPA aerosol inhalation
and 6.09 7echnetium-YYm MAA IV

[Series 1: ant/post vent · 4.14mm/px · 1 of 1 slices shown (1 of 2)]
[im 1/1]
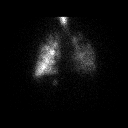

[Series 1: ant/post vent · 4.14mm/px · 1 of 1 slices shown (2 of 2)]
[im 1/1]
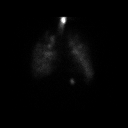

[Series 2: lao/rpo vent · 4.14mm/px · 1 of 1 slices shown (1 of 2)]
[im 1/1]
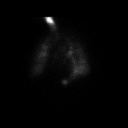

[Series 2: lao/rpo vent · 4.14mm/px · 1 of 1 slices shown (2 of 2)]
[im 1/1]
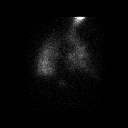

[Series 3: lpo/rao vent · 4.14mm/px · 1 of 1 slices shown (1 of 2)]
[im 1/1]
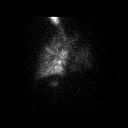

[Series 3: lpo/rao vent · 4.14mm/px · 1 of 1 slices shown (2 of 2)]
[im 1/1]
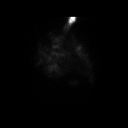

[Series 4: lt lat/rt lat vent · 4.14mm/px · 1 of 1 slices shown (1 of 2)]
[im 1/1]
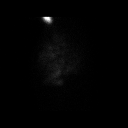

[Series 4: lt lat/rt lat vent · 4.14mm/px · 1 of 1 slices shown (2 of 2)]
[im 1/1]
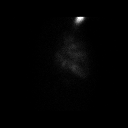

[Series 5: lt lat/rt lat perf · 4.14mm/px · 1 of 1 slices shown (1 of 2)]
[im 1/1]
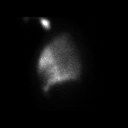

[Series 5: lt lat/rt lat perf · 4.14mm/px · 1 of 1 slices shown (2 of 2)]
[im 1/1]
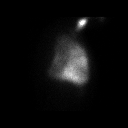

[Series 6: lpo/rao perf · 4.14mm/px · 1 of 1 slices shown (1 of 2)]
[im 1/1]
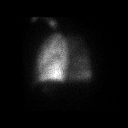

[Series 6: lpo/rao perf · 4.14mm/px · 1 of 1 slices shown (2 of 2)]
[im 1/1]
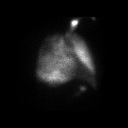

[Series 7: ant/post perf · 4.14mm/px · 1 of 1 slices shown (1 of 2)]
[im 1/1]
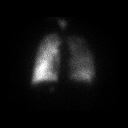

[Series 7: ant/post perf · 4.14mm/px · 1 of 1 slices shown (2 of 2)]
[im 1/1]
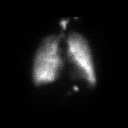

[Series 8: lao/rpo perf · 4.14mm/px · 1 of 1 slices shown (1 of 2)]
[im 1/1]
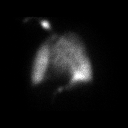

[Series 8: lao/rpo perf · 4.14mm/px · 1 of 1 slices shown (2 of 2)]
[im 1/1]
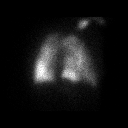

[16 of 16 positions shown; findings below may reference images not displayed]

FINDINGS: Ventilation: No focal ventilation defect, ventilation is diffusely
patchy.

Perfusion: No wedge shaped peripheral perfusion defects to suggest
acute pulmonary embolism. Small matched defect in the right upper
lobe.
IMPRESSION: Very low probability for pulmonary embolus.
